# Patient Record
Sex: Male | Born: 1955 | Race: White | Hispanic: No | Marital: Married | Smoking: Current every day smoker
Health system: Southern US, Community
[De-identification: ages and names within clinical notes are randomized; demographics above are authoritative.]

## PROBLEM LIST (undated history)

## (undated) DIAGNOSIS — I10 Essential (primary) hypertension: Secondary | ICD-10-CM

## (undated) DIAGNOSIS — N200 Calculus of kidney: Secondary | ICD-10-CM

## (undated) DIAGNOSIS — K219 Gastro-esophageal reflux disease without esophagitis: Secondary | ICD-10-CM

## (undated) HISTORY — PX: APPENDECTOMY: SHX54

## (undated) HISTORY — PX: ORTHOPEDIC SURGERY: SHX850

---

## 2014-07-24 ENCOUNTER — Emergency Department (HOSPITAL_BASED_OUTPATIENT_CLINIC_OR_DEPARTMENT_OTHER)
Admission: EM | Admit: 2014-07-24 | Discharge: 2014-07-24 | Disposition: A | Discharge status: Home / Self Care | Payer: BC Managed Care – PPO | Attending: Emergency Medicine | Admitting: Emergency Medicine

## 2014-07-24 ENCOUNTER — Encounter (HOSPITAL_BASED_OUTPATIENT_CLINIC_OR_DEPARTMENT_OTHER): Payer: Self-pay | Admitting: Emergency Medicine

## 2014-07-24 DIAGNOSIS — Z79899 Other long term (current) drug therapy: Secondary | ICD-10-CM | POA: Insufficient documentation

## 2014-07-24 DIAGNOSIS — IMO0002 Reserved for concepts with insufficient information to code with codable children: Secondary | ICD-10-CM | POA: Insufficient documentation

## 2014-07-24 DIAGNOSIS — Z8719 Personal history of other diseases of the digestive system: Secondary | ICD-10-CM | POA: Insufficient documentation

## 2014-07-24 DIAGNOSIS — T6391XA Toxic effect of contact with unspecified venomous animal, accidental (unintentional), initial encounter: Secondary | ICD-10-CM | POA: Insufficient documentation

## 2014-07-24 DIAGNOSIS — F17200 Nicotine dependence, unspecified, uncomplicated: Secondary | ICD-10-CM | POA: Insufficient documentation

## 2014-07-24 DIAGNOSIS — I10 Essential (primary) hypertension: Secondary | ICD-10-CM | POA: Insufficient documentation

## 2014-07-24 DIAGNOSIS — T63461A Toxic effect of venom of wasps, accidental (unintentional), initial encounter: Secondary | ICD-10-CM | POA: Insufficient documentation

## 2014-07-24 DIAGNOSIS — Y9389 Activity, other specified: Secondary | ICD-10-CM | POA: Insufficient documentation

## 2014-07-24 DIAGNOSIS — Y929 Unspecified place or not applicable: Secondary | ICD-10-CM | POA: Insufficient documentation

## 2014-07-24 HISTORY — DX: Essential (primary) hypertension: I10

## 2014-07-24 HISTORY — DX: Gastro-esophageal reflux disease without esophagitis: K21.9

## 2014-07-24 MED ORDER — PREDNISONE 10 MG PO TABS: 20.0000 mg | ORAL_TABLET | Freq: Every day | ORAL | Status: DC

## 2014-07-24 MED ORDER — LORATADINE 10 MG PO TABS
10.0000 mg | ORAL_TABLET | Freq: Once | ORAL | Status: AC
  Administered 2014-07-24: 10 mg via ORAL
  Filled 2014-07-24: qty 1

## 2014-07-24 MED ORDER — EPINEPHRINE 0.3 MG/0.3ML IJ SOAJ: 0.3000 mg | INTRAMUSCULAR | Status: DC | PRN

## 2014-07-24 MED ORDER — PREDNISONE 50 MG PO TABS
60.0000 mg | ORAL_TABLET | Freq: Once | ORAL | Status: AC
  Administered 2014-07-24: 60 mg via ORAL
  Filled 2014-07-24 (×2): qty 1

## 2014-07-24 MED ORDER — EPINEPHRINE 0.3 MG/0.3ML IJ SOAJ: 0.3000 mg | INTRAMUSCULAR | Status: AC | PRN

## 2014-07-24 NOTE — ED Notes (Signed)
Multi bee  stings

## 2014-07-24 NOTE — Discharge Instructions (Signed)

## 2014-07-24 NOTE — ED Provider Notes (Signed)
Medical screening examination/treatment/procedure(s) were performed by non-physician practitioner and as supervising physician I was immediately available for consultation/collaboration.   EKG Interpretation None        Layla MawKristen N Merlina Marchena, DO 07/24/14 2103

## 2014-07-24 NOTE — ED Provider Notes (Signed)
CSN: [id number]     Arrival date & time 07/24/14  1813 History   First MD Initiated Contact with Patient 07/24/14 1817     Chief Complaint  Patient presents with  . Insect Bite     (Consider location/radiation/quality/duration/timing/severity/associated sxs/prior Treatment) HPI  Patient to the ED with complaints of bee stings to right lower extremity. Nathaniel Cervantes was mowing the lawn when Nathaniel Cervantes accidentally stepped into a yellow jacket hive. Nathaniel Cervantes believes Nathaniel Cervantes  Was stung 5 times. Nathaniel Cervantes came to the ED at the encouragement of his wife because last time the joint Nathaniel Cervantes was stung in swelled  up significantly. Nathaniel Cervantes denies having any SOB, CP, neck, face, tongue, lip swelling, wheezing or systemic symptoms. Nathaniel Cervantes does not have rash and his vital signs are stable. Nathaniel Cervantes is having mild/mod pain to the sting sites.  Past Medical History  Diagnosis Date  . Hypertension   . Acid reflux    Past Surgical History  Procedure Laterality Date  . Orthopedic surgery     No family history on file. History  Substance Use Topics  . Smoking status: Current Every Day Smoker  . Smokeless tobacco: Not on file  . Alcohol Use: Yes     Comment: seldom    Review of Systems  Review of Systems  Gen: no weight loss, fevers, chills, night sweats  Eyes: no discharge or drainage, no occular pain or visual changes  Nose: no epistaxis or rhinorrhea  Mouth: no dental pain, no sore throat  Neck: no neck pain  Lungs:No wheezing or hemoptysis No coughing CV:  No palpitations, dependent edema or orthopnea. No chest pain Abd: no diarrhea. No nausea or vomiting, No abdominal pain  GU: no dysuria or gross hematuria  MSK:  No muscle weakness, No  pain Neuro: no headache, no focal neurologic deficits  Skin: no rash , + stings to RLQ Psyche: no complaints     Allergies  Review of patient's allergies indicates no known allergies.  Home Medications   Prior to Admission medications   Medication Sig Start Date End Date Taking? Authorizing  Provider  lisinopril-hydrochlorothiazide (PRINZIDE,ZESTORETIC) 10-12.5 MG per tablet Take 1 tablet by mouth daily.   Yes Historical Provider, MD  traMADol (ULTRAM) 50 MG tablet Take 50 mg by mouth every 6 (six) hours as needed.   Yes Historical Provider, MD  EPINEPHrine (EPIPEN) 0.3 mg/0.3 mL IJ SOAJ injection Inject 0.3 mLs (0.3 mg total) into the muscle as needed. 07/24/14   Bedie Dominey Irine SealG Dreux Mcgroarty, PA-C  predniSONE (DELTASONE) 10 MG tablet Take 2 tablets (20 mg total) by mouth daily. 07/24/14   Nykira Reddix Irine SealG Maleigha Colvard, PA-C   BP 146/80  Pulse 56  Temp(Src) 97.7 F (36.5 C) (Oral)  Resp 18  Ht 6\' 3"  (1.905 m)  Wt 210 lb (95.255 kg)  BMI 26.25 kg/m2  SpO2 98% Physical Exam  Nursing note and vitals reviewed. Constitutional: Nathaniel Cervantes appears well-developed and well-nourished. No distress.  HENT:  Head: Normocephalic and atraumatic.  Mouth/Throat: Uvula is midline, oropharynx is clear and moist and mucous membranes are normal.  No intraoral or neck swelling.  Eyes: Pupils are equal, round, and reactive to light.  Neck: Normal range of motion. Neck supple.  Cardiovascular: Normal rate and regular rhythm.   Pulmonary/Chest: Effort normal and breath sounds normal. Nathaniel Cervantes has no wheezes. Nathaniel Cervantes has no rhonchi.  Abdominal: Soft.  Neurological: Nathaniel Cervantes is alert.  Skin: Skin is warm and dry.       ED Course  Procedures (including critical care  time) Labs Review Labs Reviewed - No data to display  Imaging Review No results found.   EKG Interpretation None      MDM   Final diagnoses:  Yellow jacket sting, accidental or unintentional, initial encounter    Patient without signs of systemic reaction. Nathaniel Cervantes took 50 mg of Benadryl at home prior to arrival. Pt given Prednisone 60 mg and told to take his at home Allegra. Rx for prednisone taper and epi pen. Discussed when it is appropriate to use epi pen. Educated on symptoms that warrant prompt return to the ED. At this time no signs of anaphylactic reaction.  58  y.o.Nathaniel Cervantes evaluation in the Emergency Department is complete. It has been determined that no acute conditions requiring further emergency intervention are present at this time. The patient/guardian have been advised of the diagnosis and plan. We have discussed signs and symptoms that warrant return to the ED, such as changes or worsening in symptoms.  Vital signs are stable at discharge. Filed Vitals:   07/24/14 1830  BP: 146/80  Pulse: 56  Temp: 97.7 F (36.5 C)  Resp: 18    Patient/guardian has voiced understanding and agreed to follow-up with the PCP or specialist.     Dorthula Matas, PA-C 07/24/14 1901

## 2015-02-03 ENCOUNTER — Emergency Department (HOSPITAL_BASED_OUTPATIENT_CLINIC_OR_DEPARTMENT_OTHER)
Admission: EM | Admit: 2015-02-03 | Discharge: 2015-02-03 | Disposition: A | Discharge status: Home / Self Care | Payer: Medicare Other | Attending: Emergency Medicine | Admitting: Emergency Medicine

## 2015-02-03 ENCOUNTER — Emergency Department (HOSPITAL_BASED_OUTPATIENT_CLINIC_OR_DEPARTMENT_OTHER): Payer: Medicare Other

## 2015-02-03 ENCOUNTER — Encounter (HOSPITAL_BASED_OUTPATIENT_CLINIC_OR_DEPARTMENT_OTHER): Payer: Self-pay | Admitting: *Deleted

## 2015-02-03 DIAGNOSIS — Z79899 Other long term (current) drug therapy: Secondary | ICD-10-CM | POA: Diagnosis not present

## 2015-02-03 DIAGNOSIS — Z720 Tobacco use: Secondary | ICD-10-CM | POA: Insufficient documentation

## 2015-02-03 DIAGNOSIS — I10 Essential (primary) hypertension: Secondary | ICD-10-CM | POA: Insufficient documentation

## 2015-02-03 DIAGNOSIS — J069 Acute upper respiratory infection, unspecified: Secondary | ICD-10-CM | POA: Diagnosis not present

## 2015-02-03 DIAGNOSIS — R509 Fever, unspecified: Secondary | ICD-10-CM | POA: Diagnosis present

## 2015-02-03 DIAGNOSIS — B349 Viral infection, unspecified: Secondary | ICD-10-CM | POA: Insufficient documentation

## 2015-02-03 DIAGNOSIS — Z8719 Personal history of other diseases of the digestive system: Secondary | ICD-10-CM | POA: Insufficient documentation

## 2015-02-03 MED ORDER — ONDANSETRON 8 MG PO TBDP
8.0000 mg | ORAL_TABLET | Freq: Once | ORAL | Status: AC
  Administered 2015-02-03: 8 mg via ORAL
  Filled 2015-02-03: qty 1

## 2015-02-03 MED ORDER — ONDANSETRON 8 MG PO TBDP: ORAL_TABLET | ORAL | Status: DC

## 2015-02-03 NOTE — ED Notes (Signed)
States having chills   Ext cold  Temp 99.8

## 2015-02-03 NOTE — ED Provider Notes (Signed)
CSN: [id number]     Arrival date & time 02/03/15  0153 History   First MD Initiated Contact with Patient 02/03/15 0225     Chief Complaint  Patient presents with  . Fever     (Consider location/radiation/quality/duration/timing/severity/associated sxs/prior Treatment) Patient is a 59 y.o. male presenting with fever. The history is provided by the patient.  Fever Temp source:  Subjective Severity:  Mild Onset quality:  Gradual Duration:  2 hours Timing:  Constant Progression:  Unchanged Chronicity:  New Worsened by:  Nothing tried Associated symptoms: congestion and rhinorrhea   Associated symptoms: no chest pain, no confusion, no cough, no diarrhea, no dysuria, no ear pain, no headaches, no myalgias, no rash, no sore throat and no vomiting     Past Medical History  Diagnosis Date  . Hypertension   . Acid reflux    Past Surgical History  Procedure Laterality Date  . Orthopedic surgery     No family history on file. History  Substance Use Topics  . Smoking status: Current Every Day Smoker  . Smokeless tobacco: Not on file  . Alcohol Use: Yes     Comment: seldom    Review of Systems  Constitutional: Negative for fever.  HENT: Positive for congestion and rhinorrhea. Negative for ear pain, sore throat, trouble swallowing and voice change.   Respiratory: Negative for cough, shortness of breath and wheezing.   Cardiovascular: Negative for chest pain.  Gastrointestinal: Negative for vomiting, abdominal pain and diarrhea.  Genitourinary: Negative for dysuria.  Musculoskeletal: Negative for myalgias, neck pain and neck stiffness.  Skin: Negative for rash and wound.  Neurological: Negative for headaches.  Psychiatric/Behavioral: Negative for confusion.  All other systems reviewed and are negative.     Allergies  Review of patient's allergies indicates no known allergies.  Home Medications   Prior to Admission medications   Medication Sig Start Date End Date  Taking? Authorizing Provider  EPINEPHrine (EPIPEN) 0.3 mg/0.3 mL IJ SOAJ injection Inject 0.3 mLs (0.3 mg total) into the muscle as needed. 07/24/14   Tiffany Irine Seal, PA-C  lisinopril-hydrochlorothiazide (PRINZIDE,ZESTORETIC) 10-12.5 MG per tablet Take 1 tablet by mouth daily.    Historical Provider, MD  predniSONE (DELTASONE) 10 MG tablet Take 2 tablets (20 mg total) by mouth daily. 07/24/14   Tiffany Irine Seal, PA-C  traMADol (ULTRAM) 50 MG tablet Take 50 mg by mouth every 6 (six) hours as needed.    Historical Provider, MD   BP 137/80 mmHg  Pulse 86  Temp(Src) 98.8 F (37.1 C) (Oral)  Resp 18  Ht  (1.93 m)  Wt 212 lb (96.163 kg)  BMI 25.82 kg/m2  SpO2 100% Physical Exam  Constitutional: He is oriented to person, place, and time. He appears well-developed and well-nourished. No distress.  HENT:  Head: Normocephalic and atraumatic.  Right Ear: External ear normal.  Left Ear: External ear normal.  Mouth/Throat: Oropharynx is clear and moist. No oropharyngeal exudate.  Eyes: Conjunctivae and EOM are normal. Pupils are equal, round, and reactive to light.  Neck: Normal range of motion. Neck supple.  Cardiovascular: Normal rate, regular rhythm and intact distal pulses.   Pulmonary/Chest: Effort normal and breath sounds normal. No respiratory distress. He has no wheezes. He has no rales.  Abdominal: Soft. Bowel sounds are normal. There is no tenderness. There is no rebound and no guarding.  Musculoskeletal: Normal range of motion.  Neurological: He is alert and oriented to person, place, and time.  Skin: Skin is warm  and dry.  Psychiatric: He has a normal mood and affect.    ED Course  Procedures (including critical care time) Labs Review Labs Reviewed - No data to display  Imaging Review Dg Chest 2 View  02/03/2015   CLINICAL DATA:  Acute onset of chills and fever.  Initial encounter.  EXAM: CHEST  2 VIEW  COMPARISON:  None.  FINDINGS: The lungs are well-aerated. Vascular  congestion is noted, with increased interstitial markings. Given the patient's symptoms, this could reflect pneumonia. There is no evidence of pleural effusion or pneumothorax.  The heart is normal in size; the mediastinal contour is within normal limits. No acute osseous abnormalities are seen.  IMPRESSION: Vascular congestion, with increased interstitial markings. Given the patient's symptoms, this could reflect pneumonia.   Electronically Signed   By: Roanna RaiderJeffery  Chang M.D.   On: 02/03/2015 02:45     EKG Interpretation None      MDM   Final diagnoses:  URI (upper respiratory infection)  Viral syndrome   Seen and appreciate radiology note but there is no consolidation on Xray.  Re reviewed with Dr. Cherly Hensenhang via phone.    Highly doubt pneumonia as patient has not had cough and has not actually had fever.  There are no wheezes rales or rhonchi on exam and wife started vomiting in room during patient evaluation and then patient became nauseated in Xray.  Suspect this is a viral syndrome of n/v/d with runny nose etc.  Strict precautions given.      Jasmine AweApril K Reina Wilton-Rasch, MD 02/03/15 [phone]

## 2015-02-03 NOTE — ED Notes (Signed)
C/o chills,  Ext cold  States had a fever of 99.8

## 2016-06-16 ENCOUNTER — Emergency Department (HOSPITAL_BASED_OUTPATIENT_CLINIC_OR_DEPARTMENT_OTHER)
Admission: EM | Admit: 2016-06-16 | Discharge: 2016-06-16 | Disposition: A | Discharge status: Home / Self Care | Payer: Medicare Other | Attending: Emergency Medicine | Admitting: Emergency Medicine

## 2016-06-16 ENCOUNTER — Encounter (HOSPITAL_BASED_OUTPATIENT_CLINIC_OR_DEPARTMENT_OTHER): Payer: Self-pay

## 2016-06-16 DIAGNOSIS — F17200 Nicotine dependence, unspecified, uncomplicated: Secondary | ICD-10-CM | POA: Insufficient documentation

## 2016-06-16 DIAGNOSIS — R112 Nausea with vomiting, unspecified: Secondary | ICD-10-CM | POA: Insufficient documentation

## 2016-06-16 DIAGNOSIS — I10 Essential (primary) hypertension: Secondary | ICD-10-CM | POA: Diagnosis not present

## 2016-06-16 DIAGNOSIS — R197 Diarrhea, unspecified: Secondary | ICD-10-CM | POA: Insufficient documentation

## 2016-06-16 DIAGNOSIS — Z79899 Other long term (current) drug therapy: Secondary | ICD-10-CM | POA: Diagnosis not present

## 2016-06-16 DIAGNOSIS — R42 Dizziness and giddiness: Secondary | ICD-10-CM | POA: Diagnosis not present

## 2016-06-16 LAB — CBC WITH DIFFERENTIAL/PLATELET
BASOS ABS: 0 10*3/uL (ref 0.0–0.1)
BASOS PCT: 0 %
EOS ABS: 0.1 10*3/uL (ref 0.0–0.7)
Eosinophils Relative: 1 %
HCT: 43.3 % (ref 39.0–52.0)
HEMOGLOBIN: 14.8 g/dL (ref 13.0–17.0)
Lymphocytes Relative: 2 %
Lymphs Abs: 0.3 10*3/uL — ABNORMAL LOW (ref 0.7–4.0)
MCH: 30.4 pg (ref 26.0–34.0)
MCHC: 34.2 g/dL (ref 30.0–36.0)
MCV: 88.9 fL (ref 78.0–100.0)
MONOS PCT: 3 %
Monocytes Absolute: 0.3 10*3/uL (ref 0.1–1.0)
NEUTROS ABS: 12.3 10*3/uL — AB (ref 1.7–7.7)
NEUTROS PCT: 94 %
Platelets: 214 10*3/uL (ref 150–400)
RBC: 4.87 MIL/uL (ref 4.22–5.81)
RDW: 14.4 % (ref 11.5–15.5)
WBC: 13 10*3/uL — AB (ref 4.0–10.5)

## 2016-06-16 LAB — BASIC METABOLIC PANEL
ANION GAP: 10 (ref 5–15)
BUN: 10 mg/dL (ref 6–20)
CALCIUM: 8.8 mg/dL — AB (ref 8.9–10.3)
CO2: 26 mmol/L (ref 22–32)
CREATININE: 0.96 mg/dL (ref 0.61–1.24)
Chloride: 106 mmol/L (ref 101–111)
GFR calc Af Amer: >60 mL/min (ref >60)
Glucose, Bld: 110 mg/dL — ABNORMAL HIGH (ref 65–99)
Potassium: 3.7 mmol/L (ref 3.5–5.1)
SODIUM: 142 mmol/L (ref 135–145)

## 2016-06-16 MED ORDER — SODIUM CHLORIDE 0.9 % IV BOLUS (SEPSIS)
1000.0000 mL | Freq: Once | INTRAVENOUS | Status: AC
Start: 2016-06-16 — End: 2016-06-16
  Administered 2016-06-16: 1000 mL via INTRAVENOUS

## 2016-06-16 MED ORDER — ONDANSETRON HCL 4 MG/2ML IJ SOLN
4.0000 mg | Freq: Once | INTRAMUSCULAR | Status: AC
  Administered 2016-06-16: 4 mg via INTRAVENOUS
  Filled 2016-06-16: qty 2

## 2016-06-16 MED ORDER — ONDANSETRON 8 MG PO TBDP: 8.0000 mg | ORAL_TABLET | Freq: Three times a day (TID) | ORAL | Status: AC | PRN

## 2016-06-16 MED ORDER — SODIUM CHLORIDE 0.9 % IV BOLUS (SEPSIS)
1000.0000 mL | Freq: Once | INTRAVENOUS | Status: AC
  Administered 2016-06-16: 1000 mL via INTRAVENOUS

## 2016-06-16 MED FILL — ONDANSETRON ODT 8 MG TABLET: 8 | 4 days supply | Qty: 10 | Fill #0

## 2016-06-16 NOTE — ED Notes (Signed)
MD at bedside. 

## 2016-06-16 NOTE — ED Provider Notes (Signed)
CSN: [id number]     Arrival date & time 06/16/16  1108 History   First MD Initiated Contact with Patient 06/16/16 1128     Chief Complaint  Patient presents with  . Diarrhea     Patient is a 60 y.o. male presenting with diarrhea. The history is provided by the patient.  Diarrhea Associated symptoms: abdominal pain and vomiting   Associated symptoms: no headaches   Patient presents with nausea vomiting diarrhea. Began this morning. Has had numerous episodes of both. Decreased oral intake.States he is somewhat hungry but if he drinks even a little water comes back up. States he felt lightheaded and thought his to pass out. No sick contacts. States he was fishing yesterday and had a bologna sandwich but was one that he made. Slight upper abdominal pain was started but has since cleared up. No fevers or chills. Past Medical History  Diagnosis Date  . Hypertension   . Acid reflux    Past Surgical History  Procedure Laterality Date  . Orthopedic surgery     No family history on file. Social History  Substance Use Topics  . Smoking status: Current Every Day Smoker  . Smokeless tobacco: None  . Alcohol Use: Yes     Comment: occ    Review of Systems  Constitutional: Negative for activity change and appetite change.  Eyes: Negative for pain.  Respiratory: Negative for chest tightness and shortness of breath.   Cardiovascular: Negative for chest pain and leg swelling.  Gastrointestinal: Positive for nausea, vomiting, abdominal pain and diarrhea.  Genitourinary: Negative for flank pain.  Musculoskeletal: Negative for back pain and neck stiffness.  Skin: Negative for rash.  Neurological: Positive for light-headedness. Negative for weakness, numbness and headaches.  Psychiatric/Behavioral: Negative for behavioral problems.      Allergies  Review of patient's allergies indicates no known allergies.  Home Medications   Prior to Admission medications   Medication Sig Start Date End  Date Taking? Authorizing Provider  bisoprolol-hydrochlorothiazide (ZIAC) 10-6.25 MG tablet Take 1 tablet by mouth daily.   Yes Historical Provider, MD  EPINEPHrine (EPIPEN) 0.3 mg/0.3 mL IJ SOAJ injection Inject 0.3 mLs (0.3 mg total) into the muscle as needed. 07/24/14   Tiffany Neva SeatGreene, PA-C  ondansetron (ZOFRAN-ODT) 8 MG disintegrating tablet Take 1 tablet (8 mg total) by mouth every 8 (eight) hours as needed for nausea or vomiting. 06/16/16   Benjiman CoreNathan Amirrah Quigley, MD  traMADol (ULTRAM) 50 MG tablet Take 50 mg by mouth every 6 (six) hours as needed.    Historical Provider, MD   BP 110/64 mmHg  Pulse 80  Temp(Src) 99.5 F (37.5 C) (Oral)  Resp 18  Ht 6\' 4"  (1.93 m)  Wt 213 lb (96.616 kg)  BMI 25.94 kg/m2  SpO2 96% Physical Exam  Constitutional: He appears well-developed.  HENT:  Head: Atraumatic.  Eyes: EOM are normal.  Neck: Neck supple.  Cardiovascular: Normal rate.   Abdominal: Soft. There is no tenderness.  Musculoskeletal: Normal range of motion.  Neurological: He is alert.  Skin: Skin is warm.    ED Course  Procedures (including critical care time) Labs Review Labs Reviewed  BASIC METABOLIC PANEL - Abnormal; Notable for the following:    Glucose, Bld 110 (*)    Calcium 8.8 (*)    All other components within normal limits  CBC WITH DIFFERENTIAL/PLATELET - Abnormal; Notable for the following:    WBC 13.0 (*)    Neutro Abs 12.3 (*)    Lymphs Abs 0.3 (*)  All other components within normal limits    Imaging Review No results found. I have personally reviewed and evaluated these images and lab results as part of my medical decision-making.   EKG Interpretation None      MDM   Final diagnoses:  Nausea vomiting and diarrhea    Patient presents nausea vomiting diarrhea. Began earlier in the day. Felt better after IV fluids but continued to have some hypotension. Total of 3 L of fluid given and tolerated orals. Blood pressure improved. Lab work reassuring. Discharge  home.    Benjiman CoreNathan Ronetta Molla, MD 06/17/16 [phone]

## 2016-06-16 NOTE — Discharge Instructions (Signed)

## 2016-06-16 NOTE — ED Notes (Signed)
Patient is sipping on some ginger ale.

## 2016-06-16 NOTE — ED Notes (Addendum)
C/o n/v/d since 630am-NAD-steady gait-states he does feel a bit light headed-taken to tx area via w/c

## 2017-03-28 ENCOUNTER — Emergency Department (HOSPITAL_BASED_OUTPATIENT_CLINIC_OR_DEPARTMENT_OTHER)
Admission: EM | Admit: 2017-03-28 | Discharge: 2017-03-29 | Disposition: A | Discharge status: Home / Self Care | Payer: Medicare Other | Attending: Emergency Medicine | Admitting: Emergency Medicine

## 2017-03-28 ENCOUNTER — Encounter (HOSPITAL_BASED_OUTPATIENT_CLINIC_OR_DEPARTMENT_OTHER): Payer: Self-pay | Admitting: Emergency Medicine

## 2017-03-28 DIAGNOSIS — N200 Calculus of kidney: Secondary | ICD-10-CM

## 2017-03-28 DIAGNOSIS — I10 Essential (primary) hypertension: Secondary | ICD-10-CM | POA: Diagnosis not present

## 2017-03-28 DIAGNOSIS — Z79899 Other long term (current) drug therapy: Secondary | ICD-10-CM | POA: Diagnosis not present

## 2017-03-28 DIAGNOSIS — F17200 Nicotine dependence, unspecified, uncomplicated: Secondary | ICD-10-CM | POA: Diagnosis not present

## 2017-03-28 DIAGNOSIS — R1032 Left lower quadrant pain: Secondary | ICD-10-CM | POA: Diagnosis present

## 2017-03-28 HISTORY — DX: Calculus of kidney: N20.0

## 2017-03-28 NOTE — ED Triage Notes (Signed)
Pt presents to ED with c/o of LLQ pain that started last night and got worse tonight.

## 2017-03-29 ENCOUNTER — Emergency Department (HOSPITAL_BASED_OUTPATIENT_CLINIC_OR_DEPARTMENT_OTHER): Payer: Medicare Other

## 2017-03-29 LAB — COMPREHENSIVE METABOLIC PANEL
ALBUMIN: 3.9 g/dL (ref 3.5–5.0)
ALT: 40 U/L (ref 17–63)
AST: 27 U/L (ref 15–41)
Alkaline Phosphatase: 67 U/L (ref 38–126)
Anion gap: 11 (ref 5–15)
BILIRUBIN TOTAL: 0.2 mg/dL — AB (ref 0.3–1.2)
BUN: 13 mg/dL (ref 6–20)
CO2: 25 mmol/L (ref 22–32)
Calcium: 9.5 mg/dL (ref 8.9–10.3)
Chloride: 101 mmol/L (ref 101–111)
Creatinine, Ser: 1.24 mg/dL (ref 0.61–1.24)
GFR calc Af Amer: >60 mL/min (ref >60)
GFR calc non Af Amer: >60 mL/min (ref >60)
GLUCOSE: 119 mg/dL — AB (ref 65–99)
POTASSIUM: 3.6 mmol/L (ref 3.5–5.1)
Sodium: 137 mmol/L (ref 135–145)
Total Protein: 6.7 g/dL (ref 6.5–8.1)

## 2017-03-29 LAB — CBC WITH DIFFERENTIAL/PLATELET
Basophils Absolute: 0.1 10*3/uL (ref 0.0–0.1)
Basophils Relative: 1 %
Eosinophils Absolute: 0.7 10*3/uL (ref 0.0–0.7)
Eosinophils Relative: 7 %
HEMATOCRIT: 41.4 % (ref 39.0–52.0)
Hemoglobin: 14.5 g/dL (ref 13.0–17.0)
LYMPHS PCT: 26 %
Lymphs Abs: 2.8 10*3/uL (ref 0.7–4.0)
MCH: 31.1 pg (ref 26.0–34.0)
MCHC: 35 g/dL (ref 30.0–36.0)
MCV: 88.8 fL (ref 78.0–100.0)
MONO ABS: 1.2 10*3/uL — AB (ref 0.1–1.0)
Monocytes Relative: 11 %
NEUTROS ABS: 6.3 10*3/uL (ref 1.7–7.7)
Neutrophils Relative %: 57 %
Platelets: 279 10*3/uL (ref 150–400)
RBC: 4.66 MIL/uL (ref 4.22–5.81)
RDW: 13.4 % (ref 11.5–15.5)
WBC: 11 10*3/uL — ABNORMAL HIGH (ref 4.0–10.5)

## 2017-03-29 LAB — URINALYSIS, ROUTINE W REFLEX MICROSCOPIC
Bilirubin Urine: NEGATIVE
Glucose, UA: NEGATIVE mg/dL
KETONES UR: NEGATIVE mg/dL
Leukocytes, UA: NEGATIVE
Nitrite: NEGATIVE
PH: 6.5 (ref 5.0–8.0)
Protein, ur: NEGATIVE mg/dL
Specific Gravity, Urine: 1.017 (ref 1.005–1.030)

## 2017-03-29 LAB — URINALYSIS, MICROSCOPIC (REFLEX)

## 2017-03-29 LAB — LIPASE, BLOOD: Lipase: 33 U/L (ref 11–51)

## 2017-03-29 MED ORDER — TAMSULOSIN HCL 0.4 MG PO CAPS: 0.4000 mg | ORAL_CAPSULE | Freq: Every day | ORAL | 0 refills | Status: AC

## 2017-03-29 MED ORDER — OXYCODONE-ACETAMINOPHEN 5-325 MG PO TABS: 1.0000 | ORAL_TABLET | Freq: Four times a day (QID) | ORAL | 0 refills | Status: AC | PRN

## 2017-03-29 MED ORDER — SODIUM CHLORIDE 0.9 % IV BOLUS (SEPSIS)
1000.0000 mL | Freq: Once | INTRAVENOUS | Status: AC
  Administered 2017-03-29: 1000 mL via INTRAVENOUS

## 2017-03-29 MED ORDER — IOPAMIDOL (ISOVUE-300) INJECTION 61%
100.0000 mL | Freq: Once | INTRAVENOUS | Status: AC | PRN
  Administered 2017-03-29: 100 mL via INTRAVENOUS

## 2017-03-29 MED ORDER — MORPHINE SULFATE (PF) 4 MG/ML IV SOLN
4.0000 mg | Freq: Once | INTRAVENOUS | Status: AC
  Administered 2017-03-29: 4 mg via INTRAVENOUS
  Filled 2017-03-29: qty 1

## 2017-03-29 MED ORDER — ONDANSETRON HCL 4 MG/2ML IJ SOLN
4.0000 mg | Freq: Once | INTRAMUSCULAR | Status: AC
  Administered 2017-03-29: 4 mg via INTRAVENOUS
  Filled 2017-03-29: qty 2

## 2017-03-29 NOTE — Discharge Instructions (Signed)
Percocet as prescribed as needed for pain.  Permax daily as prescribed.  Follow-up with urology if you have not passed the stone in the next 2 days. The contact information for [facility] has been provided in this discharge summary for you to call and make these arrangements.   Return to the emergency department in the meantime if you develop high fever, worsening pain, vomiting, or other new and concerning symptoms.

## 2017-03-29 NOTE — ED Notes (Signed)
ED Provider at bedside. 

## 2017-03-29 NOTE — ED Notes (Signed)
Pt c/o LLQ pain that radiates to back at times onset yesterday. Denies N/V/D. No BM today.

## 2017-03-29 NOTE — ED Provider Notes (Signed)
[facility] Provider Note   CSN: [id number] Arrival date & time: 03/28/17  2345  By signing my name below, I, Nathaniel Cervantes, attest that this documentation has been prepared under the direction and in the presence of Geoffery Lyons, MD . Electronically Signed: Nelwyn Cervantes, Scribe. 03/29/2017. 12:01 AM.  History   Chief Complaint Chief Complaint  Patient presents with  . Abdominal Pain   The history is provided by the patient. No language interpreter was used.    HPI Comments:  Nathaniel Cervantes is a 61 y.o. male with pmhx of HTN who presents to the Emergency Department complaining of intermittent, worsening, moderate LLQ abdominal pain onset 2 days ago. He describes his symptoms as a 7/10 pain which occasionally radiates to his back. His symptoms are alleviated when he lays on his left side. P/o intake normal. He reports associated increased flatulence. No medications taken PTA. Denies any diarrhea, difficulty urinating or dysuria. Pt has a previous abdominal shx of appendectomy.   Past Medical History:  Diagnosis Date  . Acid reflux   . Hypertension   . Kidney stones, calcium oxalate     There are no active problems to display for this patient.   Past Surgical History:  Procedure Laterality Date  . APPENDECTOMY    . ORTHOPEDIC SURGERY         Home Medications    Prior to Admission medications   Medication Sig Start Date End Date Taking? Authorizing Provider  bisoprolol-hydrochlorothiazide (ZIAC) 10-6.25 MG tablet Take 1 tablet by mouth daily.    Historical Provider, MD  EPINEPHrine (EPIPEN) 0.3 mg/0.3 mL IJ SOAJ injection Inject 0.3 mLs (0.3 mg total) into the muscle as needed. 07/24/14   Tiffany Neva Seat, PA-C  ondansetron (ZOFRAN-ODT) 8 MG disintegrating tablet Take 1 tablet (8 mg total) by mouth every 8 (eight) hours as needed for nausea or vomiting. 06/16/16   Benjiman Core, MD  traMADol (ULTRAM) 50 MG tablet Take 50 mg by mouth every 6 (six) hours as needed.     Historical Provider, MD    Family History No family history on file.  Social History Social History  Substance Use Topics  . Smoking status: Current Every Day Smoker  . Smokeless tobacco: Not on file  . Alcohol use Yes     Comment: occ     Allergies   Patient has no known allergies.   Review of Systems Review of Systems  Gastrointestinal: Positive for abdominal pain. Negative for diarrhea.       Positive for Flatulence  Genitourinary: Negative for difficulty urinating and dysuria.  All other systems reviewed and are negative.    Physical Exam Updated Vital Signs BP (!) 191/89 (BP Location: Left Arm)   Pulse (!) 55   Temp 98.1 F (36.7 C) (Oral)   Resp (!) 22   Ht 6' 3.5" (1.918 m)   Wt 217 lb (98.4 kg)   SpO2 100%   BMI 26.77 kg/m   Physical Exam  Constitutional: He is oriented to person, place, and time. He appears well-developed and well-nourished.  HENT:  Head: Normocephalic and atraumatic.  Eyes: EOM are normal.  Neck: Normal range of motion.  Cardiovascular: Normal rate, regular rhythm, normal heart sounds and intact distal pulses.   Pulmonary/Chest: Effort normal and breath sounds normal. No respiratory distress.  Abdominal: Soft. He exhibits no distension. There is tenderness. There is no rebound and no guarding.  TTP of left mid-abdomen. No rebound or guarding.   Musculoskeletal: Normal range of  motion.  Neurological: He is alert and oriented to person, place, and time.  Skin: Skin is warm and dry.  Psychiatric: He has a normal mood and affect. Judgment normal.  Nursing note and vitals reviewed.   ED Treatments / Results  DIAGNOSTIC STUDIES:  Oxygen Saturation is 100% on RA, normal by my interpretation.    COORDINATION OF CARE:  12:12 AM Discussed treatment plan with pt at bedside which includes blood work and CT imaging and pt agreed to plan.  Labs (all labs ordered are listed, but only abnormal results are displayed) Labs Reviewed - No  data to display  EKG  EKG Interpretation None       Radiology No results found.  Procedures Procedures (including critical care time)  Medications Ordered in ED Medications - No data to display   Initial Impression / Assessment and Plan / ED Course  I have reviewed the triage vital signs and the nursing notes.  Pertinent labs & imaging results that were available during my care of the patient were reviewed by me and considered in my medical decision making (see chart for details).  Patient presents with left-sided abdominal pain for the past day and a half. His workup reveals a 5 mm stone in the proximal ureter. He will be treated with pain medication and follow-up with urology if not improving.  Final Clinical Impressions(s) / ED Diagnoses   Final diagnoses:  None    New Prescriptions New Prescriptions   No medications on file  I personally performed the services described in this documentation, which was scribed in my presence. The recorded information has been reviewed and is accurate.        Geoffery Lyons, MD 03/29/17 [phone]

## 2018-10-19 IMAGING — CT CT ABD-PELV W/ CM
2 of 5 series · 15 of 46 positions shown, 17 images · IV contrast (APPLIED)
Comparison: None.

CLINICAL DATA: 60 y/o M; worsening left lower quadrant abdominal
pain. History of hypertension, kidney stones, and appendectomy.

EXAM:
CT ABDOMEN AND PELVIS WITH CONTRAST
TECHNIQUE: Multidetector CT imaging of the abdomen and pelvis was performed
using the standard protocol following bolus administration of
intravenous contrast.
CONTRAST:  100mL [id number] IOPAMIDOL ([id number]) INJECTION 61%

[Series 2: axial st · axial · 0.78mm/px · z∈[-751,-296]mm · 12 of 103 slices shown, 14 images]
[im 6/103  soft-tissue]
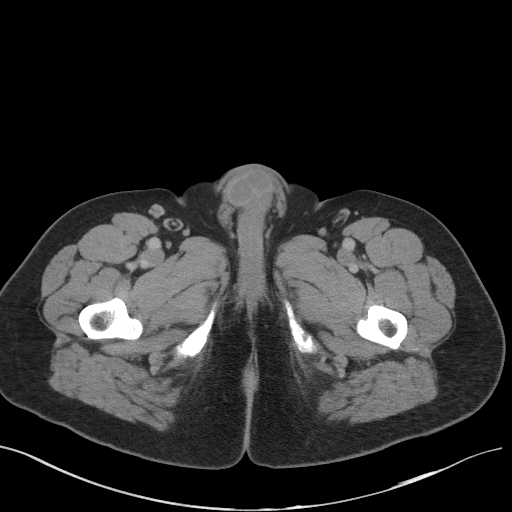
[im 6/103  bone]
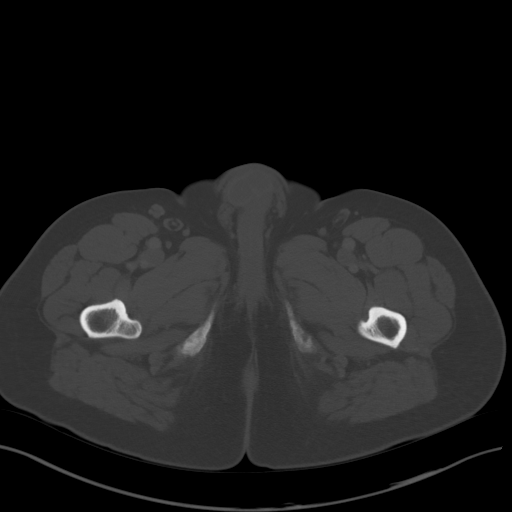
[im 16/103  soft-tissue]
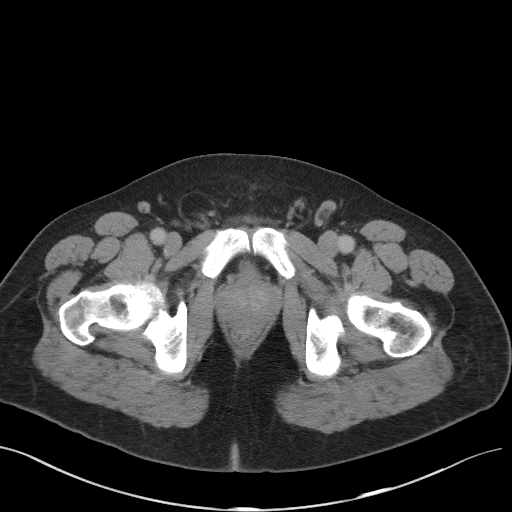
[im 21/103  soft-tissue]
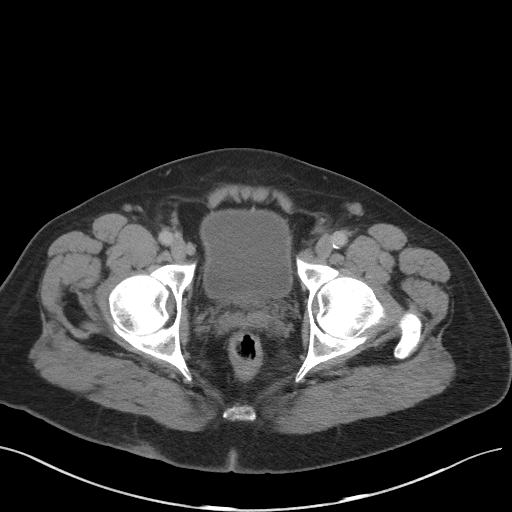
[im 31/103  soft-tissue]
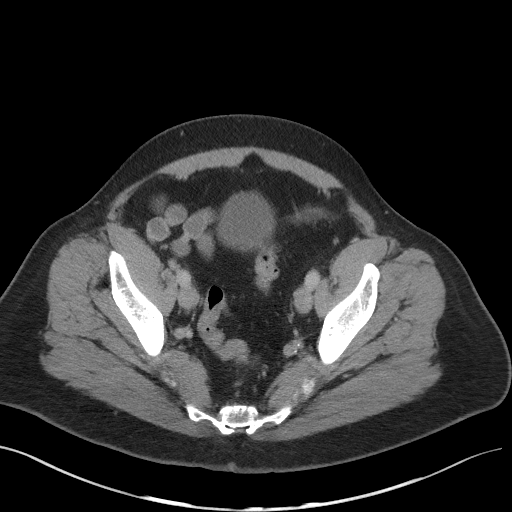
[im 41/103  soft-tissue]
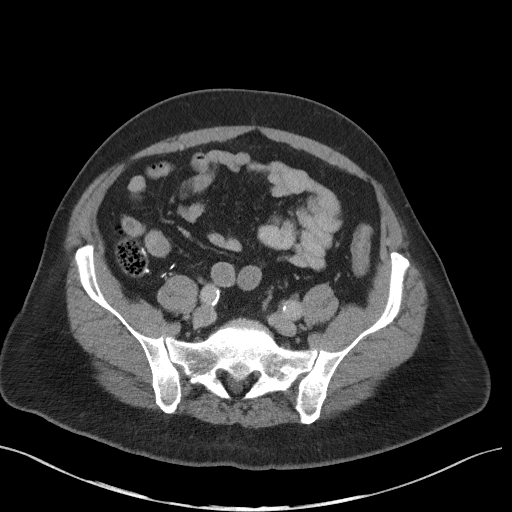
[im 46/103  soft-tissue]
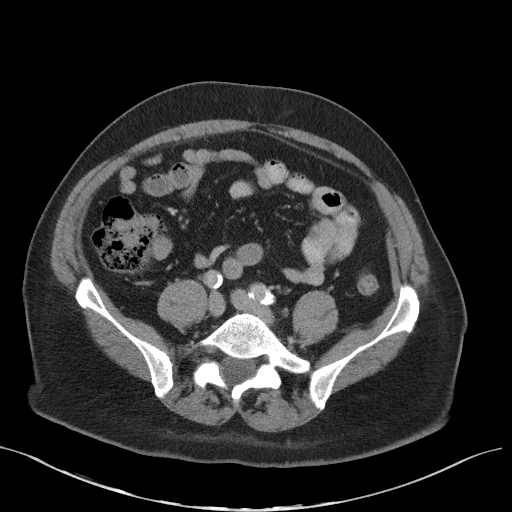
[im 57/103  soft-tissue]
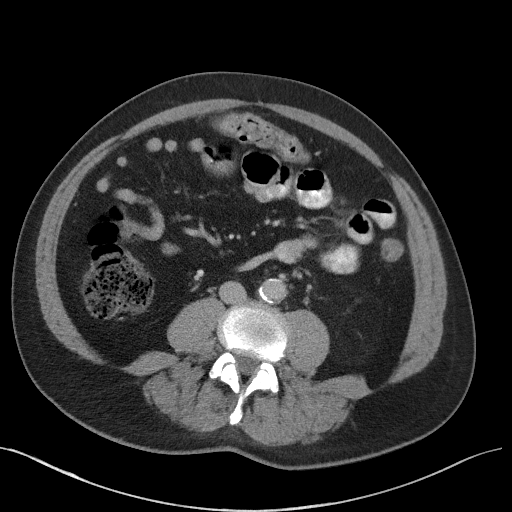
[im 62/103  soft-tissue]
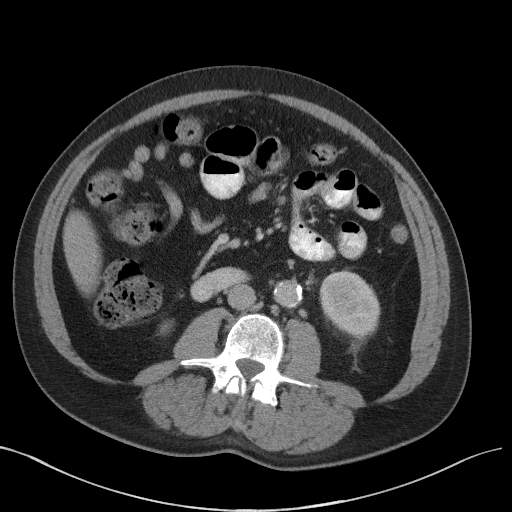
[im 72/103  soft-tissue]
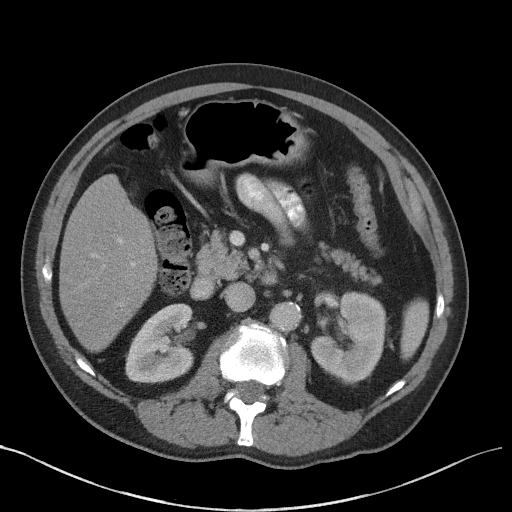
[im 72/103  bone]
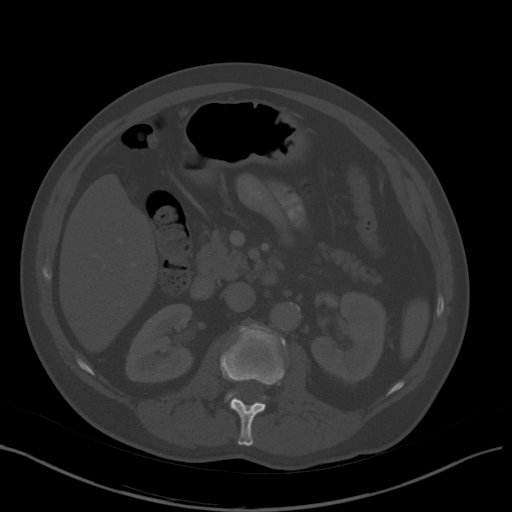
[im 82/103  soft-tissue]
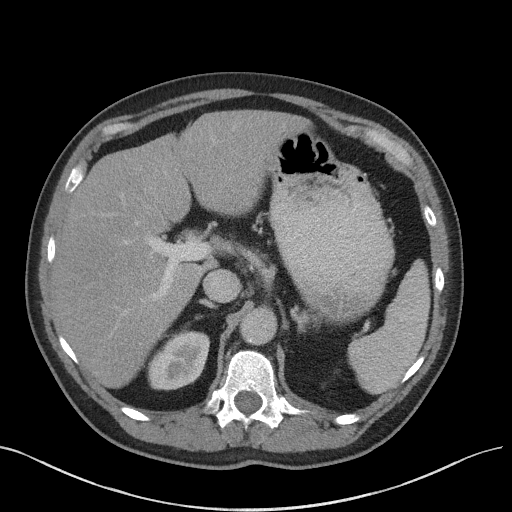
[im 87/103  soft-tissue]
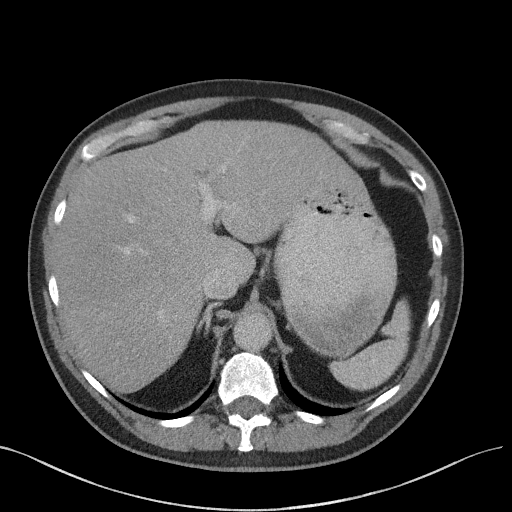
[im 97/103  soft-tissue]
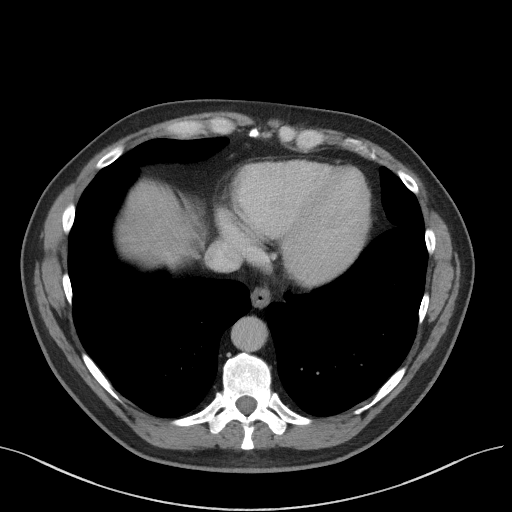

[Series 5: coronal st · coronal · 0.77mm/px · 3 of 110 slices shown]
[im 37/110  soft-tissue]
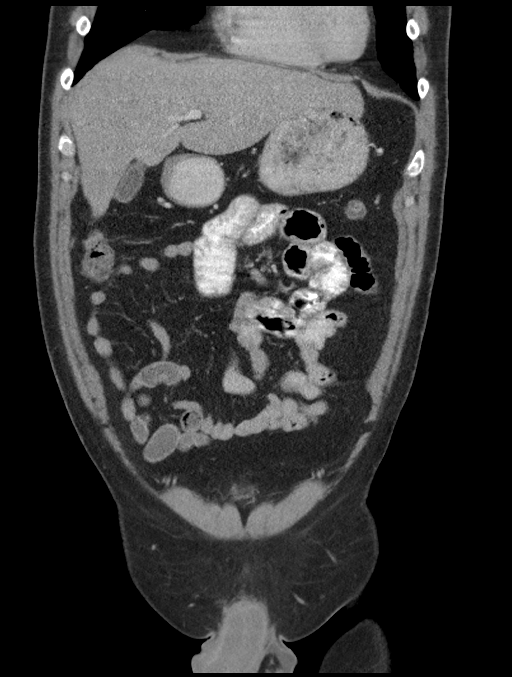
[im 49/110  soft-tissue]
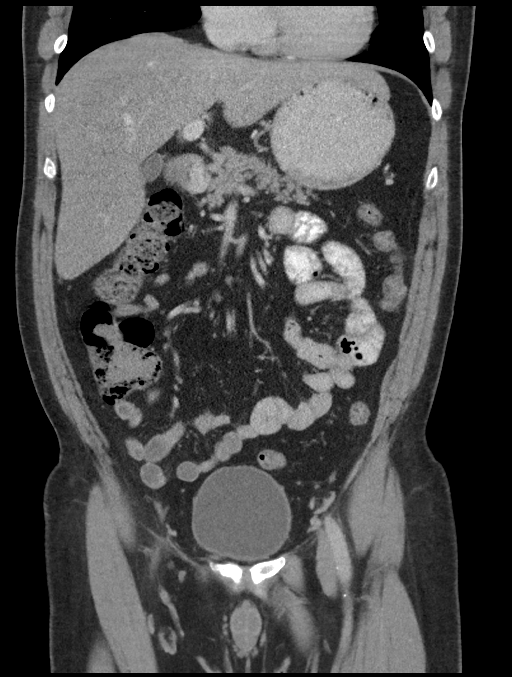
[im 61/110  soft-tissue]
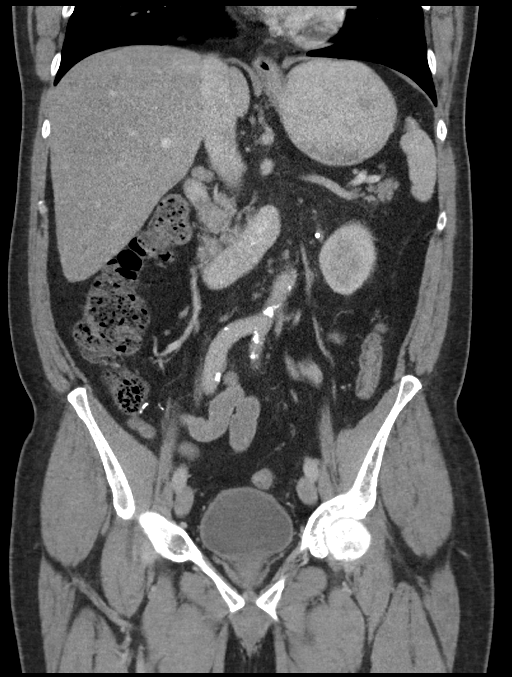

[15 of 46 positions shown; findings below may reference images not displayed]

FINDINGS: Lower chest: No acute abnormality.

Hepatobiliary: Subcentimeter lucencies in segment 2, segment 8, and
segment 6 likely representing cysts. Otherwise no focal liver lesion
identified. Normal gallbladder. No intra or extrahepatic biliary
ductal dilatation.

Pancreas: Unremarkable. No pancreatic ductal dilatation or
surrounding inflammatory changes.

Spleen: Normal in size without focal abnormality.

Adrenals/Urinary Tract: Multiple punctate nonobstructing kidney
stones bilaterally and 4 mm stone in the left interpolar region. No
right hydronephrosis.

Mild left pelviectasis and perinephric stranding secondary to a 5 mm
stone at the left ureteropelvic junction (Series 4, image 36).
Delayed left-sided nephrogram.

Normal adrenal glands and bladder.

Stomach/Bowel: Stomach is within normal limits. Appendix resected.
No evidence of bowel wall thickening, distention, or inflammatory
changes.

Vascular/Lymphatic: Aortic atherosclerosis with moderate
calcification. No enlarged abdominal or pelvic lymph nodes.

Reproductive: Mild prostate enlargement.

Other: No abdominal wall hernia or abnormality. No abdominopelvic
ascites.

Musculoskeletal: Mild levocurvature of the lumbar spine. Lumbar
spondylosis with mild discogenic and moderate lower lumbar facet
arthropathy.
IMPRESSION: 1. Mild left pelviectasis, perinephric stranding, and delayed
nephrogram secondary to a 5 mm stone at the left ureteropelvic
junction.
2. Bilateral nonobstructing kidney stones.
3. Calcific aortic atherosclerosis.
4. Mild prostate enlargement.
5. Lumbar spondylosis with prominent facet arthropathy.

By: Quirijn Amazigh M.D.

## 2018-10-23 ENCOUNTER — Other Ambulatory Visit: Payer: Self-pay

## 2018-10-23 ENCOUNTER — Emergency Department (HOSPITAL_BASED_OUTPATIENT_CLINIC_OR_DEPARTMENT_OTHER): Payer: Medicare HMO

## 2018-10-23 ENCOUNTER — Encounter (HOSPITAL_BASED_OUTPATIENT_CLINIC_OR_DEPARTMENT_OTHER): Payer: Self-pay | Admitting: Emergency Medicine

## 2018-10-23 ENCOUNTER — Inpatient Hospital Stay (HOSPITAL_BASED_OUTPATIENT_CLINIC_OR_DEPARTMENT_OTHER)
Admission: EM | Admit: 2018-10-23 | Discharge: 2018-10-25 | DRG: 603 | Disposition: A | Discharge status: Home / Self Care | Payer: Medicare HMO | Attending: Internal Medicine | Admitting: Internal Medicine

## 2018-10-23 DIAGNOSIS — K625 Hemorrhage of anus and rectum: Secondary | ICD-10-CM | POA: Diagnosis present

## 2018-10-23 DIAGNOSIS — Z720 Tobacco use: Secondary | ICD-10-CM | POA: Diagnosis present

## 2018-10-23 DIAGNOSIS — Z23 Encounter for immunization: Secondary | ICD-10-CM

## 2018-10-23 DIAGNOSIS — L03116 Cellulitis of left lower limb: Secondary | ICD-10-CM | POA: Diagnosis not present

## 2018-10-23 DIAGNOSIS — Z79899 Other long term (current) drug therapy: Secondary | ICD-10-CM

## 2018-10-23 DIAGNOSIS — Z888 Allergy status to other drugs, medicaments and biological substances status: Secondary | ICD-10-CM

## 2018-10-23 DIAGNOSIS — K219 Gastro-esophageal reflux disease without esophagitis: Secondary | ICD-10-CM | POA: Diagnosis present

## 2018-10-23 DIAGNOSIS — M7989 Other specified soft tissue disorders: Secondary | ICD-10-CM

## 2018-10-23 DIAGNOSIS — F17200 Nicotine dependence, unspecified, uncomplicated: Secondary | ICD-10-CM | POA: Diagnosis present

## 2018-10-23 DIAGNOSIS — I10 Essential (primary) hypertension: Secondary | ICD-10-CM | POA: Diagnosis present

## 2018-10-23 LAB — CBC WITH DIFFERENTIAL/PLATELET
ABS IMMATURE GRANULOCYTES: 0.13 10*3/uL — AB (ref 0.00–0.07)
BASOS PCT: 1 %
Basophils Absolute: 0.1 10*3/uL (ref 0.0–0.1)
EOS ABS: 0.3 10*3/uL (ref 0.0–0.5)
Eosinophils Relative: 2 %
HEMATOCRIT: 42.4 % (ref 39.0–52.0)
Hemoglobin: 14.1 g/dL (ref 13.0–17.0)
Immature Granulocytes: 1 %
LYMPHS ABS: 2.4 10*3/uL (ref 0.7–4.0)
Lymphocytes Relative: 16 %
MCH: 29.9 pg (ref 26.0–34.0)
MCHC: 33.3 g/dL (ref 30.0–36.0)
MCV: 89.8 fL (ref 80.0–100.0)
MONO ABS: 1.1 10*3/uL — AB (ref 0.1–1.0)
MONOS PCT: 7 %
Neutro Abs: 10.8 10*3/uL — ABNORMAL HIGH (ref 1.7–7.7)
Neutrophils Relative %: 73 %
PLATELETS: 312 10*3/uL (ref 150–400)
RBC: 4.72 MIL/uL (ref 4.22–5.81)
RDW: 13.6 % (ref 11.5–15.5)
WBC: 14.7 10*3/uL — ABNORMAL HIGH (ref 4.0–10.5)
nRBC: 0 % (ref 0.0–0.2)

## 2018-10-23 LAB — BASIC METABOLIC PANEL
Anion gap: 13 (ref 5–15)
BUN: 15 mg/dL (ref 8–23)
CALCIUM: 9.5 mg/dL (ref 8.9–10.3)
CO2: 26 mmol/L (ref 22–32)
Chloride: 96 mmol/L — ABNORMAL LOW (ref 98–111)
Creatinine, Ser: 1.01 mg/dL (ref 0.61–1.24)
GFR calc Af Amer: >60 mL/min (ref >60)
GFR calc non Af Amer: >60 mL/min (ref >60)
GLUCOSE: 112 mg/dL — AB (ref 70–99)
Potassium: 3.5 mmol/L (ref 3.5–5.1)
Sodium: 135 mmol/L (ref 135–145)

## 2018-10-23 MED ORDER — SODIUM CHLORIDE 0.9 % IV SOLN
1.0000 g | Freq: Once | INTRAVENOUS | Status: AC
  Administered 2018-10-23: 1 g via INTRAVENOUS
  Filled 2018-10-23: qty 10

## 2018-10-23 MED ORDER — MORPHINE SULFATE (PF) 4 MG/ML IV SOLN
4.0000 mg | Freq: Once | INTRAVENOUS | Status: AC
  Administered 2018-10-23: 4 mg via INTRAVENOUS
  Filled 2018-10-23: qty 1

## 2018-10-23 MED ORDER — SODIUM CHLORIDE 0.9 % IV SOLN
INTRAVENOUS | Status: DC | PRN
  Administered 2018-10-23: 500 mL via INTRAVENOUS

## 2018-10-23 MED ORDER — VANCOMYCIN HCL IN DEXTROSE 1-5 GM/200ML-% IV SOLN
1000.0000 mg | Freq: Once | INTRAVENOUS | Status: AC
  Administered 2018-10-23: 1000 mg via INTRAVENOUS
  Filled 2018-10-23: qty 200

## 2018-10-23 NOTE — Care Management (Signed)
This is a no charge note  Transfer from [facility] per Dr. Donnald Garre  62 year old male with past medical history of hypertension, GERD, tobacco abuse, BPH, kidney stone, who presents with left lower leg and foot swelling and pain.  Lower extremity venous Dopplers negative for DVT.  Clinically consistent with a cellulitis per ED physician.  Of note, patient had right leg cellulitis, completed Bactrim treatment approximately 10 days ago.  Patient was found to have WBC 14.7, electrolytes renal function okay, temperature 99, no tachycardia, no tachypnea, oxygen saturation 98% on room air.  Patient is placed on MedSurg bed for observation.  Vancomycin and Rocephin were ordered by ED.   Please call manager of [facility] at [phone] when pt arrives to floor   Lorretta Harp, MD  [facility] Pager [phone]  If 7PM-7AM, please contact night-coverage [email or web address] Password [facility] 10/23/2018, 9:49 PM

## 2018-10-23 NOTE — ED Triage Notes (Signed)
L foot swelling, pain and warmth since yesterday. + pedal pulse. Denies injury.

## 2018-10-23 NOTE — ED Notes (Signed)
ED Provider at bedside. 

## 2018-10-23 NOTE — ED Provider Notes (Signed)
[facility] EMERGENCY DEPARTMENT Provider Note   CSN: [id number] Arrival date & time: 10/23/18  1536     History   Chief Complaint Chief Complaint  Patient presents with  . Leg Swelling    HPI Nathaniel Cervantes is a 62 y.o. male.  HPI Reports he started to get pain and swelling in the foot yesterday.  He reports today the leg and foot are much more swollen.  It now hurts quite a bit to try to walk on the foot.  No known injury.  No prior history of DVT.  She reports she had some fire ant bites on both legs about 3 weeks ago.  Had to take Bactrim for about 2 weeks for the swelling and redness on the right lower extremity.  That improved.  Since then the left lower extremity started suddenly swelling and getting very painful with no known reason.  No other associated symptoms. Past Medical History:  Diagnosis Date  . Acid reflux   . Hypertension   . Kidney stones, calcium oxalate     Patient Active Problem List   Diagnosis Date Noted  . Left leg cellulitis 10/23/2018    Past Surgical History:  Procedure Laterality Date  . APPENDECTOMY    . ORTHOPEDIC SURGERY          Home Medications    Prior to Admission medications   Medication Sig Start Date End Date Taking? Authorizing Provider  EPINEPHrine (EPIPEN) 0.3 mg/0.3 mL IJ SOAJ injection Inject 0.3 mLs (0.3 mg total) into the muscle as needed. 07/24/14  Yes Neva Seat, Tiffany, PA-C  bisoprolol-hydrochlorothiazide [facility]) 10-6.25 MG tablet Take 1 tablet by mouth daily.    [provider]  metoprolol tartrate (LOPRESSOR) 25 MG tablet  10/14/18   [provider]  Multiple Vitamin (MULTI-VITAMINS) TABS Take by mouth.    [provider]  Omega-3 1000 MG CAPS Take by mouth.    [provider]  omeprazole (PRILOSEC) 40 MG capsule  10/14/18   [provider]  ondansetron (ZOFRAN-ODT) 8 MG disintegrating tablet Take 1 tablet (8 mg total) by mouth every 8 (eight) hours as needed for  nausea or vomiting. 06/16/16   Benjiman Core, MD  oxyCODONE-acetaminophen (PERCOCET) 5-325 MG tablet Take 1-2 tablets by mouth every 6 (six) hours as needed. 03/29/17   Geoffery Lyons, MD  tamsulosin (FLOMAX) 0.4 MG CAPS capsule Take 1 capsule (0.4 mg total) by mouth daily. 03/29/17   Geoffery Lyons, MD  traMADol (ULTRAM) 50 MG tablet Take 50 mg by mouth every 6 (six) hours as needed.    [provider]  triamterene-hydrochlorothiazide Ilsa Iha) 37.5-25 MG capsule  10/14/18   [provider]    Family History No family history on file.  Social History Social History   Tobacco Use  . Smoking status: Current Every Day Smoker  . Smokeless tobacco: Never Used  Substance Use Topics  . Alcohol use: Yes    Comment: occ  . Drug use: No     Allergies   Patient has no known allergies.   Review of Systems Review of Systems 10 Systems reviewed and are negative for acute change except as noted in the HPI.  Physical Exam Updated Vital Signs BP 129/78 (BP Location: Right Arm)   Pulse 82   Temp 99 F (37.2 C) (Oral)   Resp 18   Ht 6\' 3"  (1.905 m)   Wt 95.3 kg   SpO2 98%   BMI 26.25 kg/m   Physical Exam  Constitutional: He is oriented to person, place, and time. He appears well-developed and well-nourished. No distress.  HENT:  Head: Normocephalic and atraumatic.  Eyes: EOM are normal.  Cardiovascular: Normal rate, regular rhythm, normal heart sounds and intact distal pulses.  Pulmonary/Chest: Effort normal and breath sounds normal.  Abdominal: Soft. He exhibits no distension. There is no tenderness. There is no guarding.  Musculoskeletal:  3+ pitting edema of the right foot and lower leg.  All of the foot is tender but no palpable areas of fluctuance.  No Wounds visible.  Pitting edema at the ankle of the right foot.  Lower leg and foot however are nontender.  Neurological: He is alert and oriented to person, place, and time. He exhibits normal muscle tone.  Coordination normal.  Skin: Skin is warm and dry.  Psychiatric: He has a normal mood and affect.           ED Treatments / Results  Labs (all labs ordered are listed, but only abnormal results are displayed) Labs Reviewed  BASIC METABOLIC PANEL - Abnormal; Notable for the following components:      Result Value   Chloride 96 (*)    Glucose, Bld 112 (*)    All other components within normal limits  CBC WITH DIFFERENTIAL/PLATELET - Abnormal; Notable for the following components:   WBC 14.7 (*)    Neutro Abs 10.8 (*)    Monocytes Absolute 1.1 (*)    Abs Immature Granulocytes 0.13 (*)    All other components within normal limits  CULTURE, BLOOD (ROUTINE X 2)  CULTURE, BLOOD (ROUTINE X 2)    EKG None  Radiology US Venous Img Lower Bilateral  Result Date: 10/23/2018 CLINICAL DATA:  Leg pain and swelling EXAM: BILATERAL LOWER EXTREMITY VENOUS DOPPLER ULTRASOUND TECHNIQUE: Gray-scale sonography with graded compression, as well as color Doppler and duplex ultrasound were performed to evaluate the lower extremity deep venous systems from the level of the common femoral vein and including the common femoral, femoral, profunda femoral, popliteal and calf veins including the posterior tibial, peroneal and gastrocnemius veins when visible. The superficial great saphenous vein was also interrogated. Spectral Doppler was utilized to evaluate flow at rest and with distal augmentation maneuvers in the common femoral, femoral and popliteal veins. COMPARISON:  None. FINDINGS: RIGHT LOWER EXTREMITY Common Femoral Vein: No evidence of thrombus. Normal compressibility, respiratory phasicity and response to augmentation. Saphenofemoral Junction: No evidence of thrombus. Normal compressibility and flow on color Doppler imaging. Profunda Femoral Vein: No evidence of thrombus. Normal compressibility and flow on color Doppler imaging. Femoral Vein: No evidence of thrombus. Normal compressibility,  respiratory phasicity and response to augmentation. Popliteal Vein: No evidence of thrombus. Normal compressibility, respiratory phasicity and response to augmentation. Calf Veins: No evidence of thrombus. Normal compressibility and flow on color Doppler imaging. Superficial Great Saphenous Vein: No evidence of thrombus. Normal compressibility. Venous Reflux:  None. Other Findings:  None. LEFT LOWER EXTREMITY Common Femoral Vein: No evidence of thrombus. Normal compressibility, respiratory phasicity and response to augmentation. Saphenofemoral Junction: No evidence of thrombus. Normal compressibility and flow on color Doppler imaging. Profunda Femoral Vein: No evidence of thrombus. Normal compressibility and flow on color Doppler imaging. Femoral Vein: No evidence of thrombus. Normal compressibility, respiratory phasicity and response to augmentation. Popliteal Vein: No evidence of thrombus. Normal compressibility, respiratory phasicity and response to augmentation. Calf Veins: No evidence of thrombus. Normal compressibility and flow on color Doppler imaging. Superficial Great Saphenous Vein: No evidence of thrombus. Normal compressibility.  Venous Reflux:  None. Other Findings:  None. IMPRESSION: No evidence of deep venous thrombosis. Electronically Signed   By: Alcide Clever M.D.   On: 10/23/2018 20:24    Procedures Procedures (including critical care time)  Medications Ordered in ED Medications  cefTRIAXone (ROCEPHIN) 1 g in sodium chloride 0.9 % 100 mL IVPB (1 g Intravenous New Bag/Given 10/23/18 2149)  vancomycin (VANCOCIN) IVPB 1000 mg/200 mL premix (has no administration in time range)  0.9 %  sodium chloride infusion (500 mLs Intravenous New Bag/Given 10/23/18 2146)  morphine 4 MG/ML injection 4 mg (4 mg Intravenous Given 10/23/18 2146)     Initial Impression / Assessment and Plan / ED Course  I have reviewed the triage vital signs and the nursing notes.  Pertinent labs & imaging results that  were available during my care of the patient were reviewed by me and considered in my medical decision making (see chart for details).     Consult: Reviewed with Dr. Youlanda Roys for admission.  DVT ruled out.  Patient has 2+ pedal pulses bilaterally.  SPECT cellulitis.  She has severe pain with walking on the foot.  However, it is not exquisitely tender to light touch to suggest gout.  Had been on Bactrim up until about a week ago.  At this time with leukocytosis, very large swelling and fairly severe foot pain, plan for admission.  Final Clinical Impressions(s) / ED Diagnoses   Final diagnoses:  Leg swelling  Cellulitis of left lower extremity    ED Discharge Orders    None       Arby Barrette, MD 10/23/18 2159

## 2018-10-23 NOTE — ED Notes (Signed)
[facility] notified (Doug) - patient ready for transport 

## 2018-10-24 ENCOUNTER — Encounter (HOSPITAL_COMMUNITY): Payer: Self-pay | Admitting: Internal Medicine

## 2018-10-24 ENCOUNTER — Other Ambulatory Visit: Payer: Self-pay

## 2018-10-24 DIAGNOSIS — Z720 Tobacco use: Secondary | ICD-10-CM | POA: Diagnosis present

## 2018-10-24 DIAGNOSIS — K219 Gastro-esophageal reflux disease without esophagitis: Secondary | ICD-10-CM | POA: Diagnosis not present

## 2018-10-24 DIAGNOSIS — Z79899 Other long term (current) drug therapy: Secondary | ICD-10-CM | POA: Diagnosis not present

## 2018-10-24 DIAGNOSIS — Z888 Allergy status to other drugs, medicaments and biological substances status: Secondary | ICD-10-CM | POA: Diagnosis not present

## 2018-10-24 DIAGNOSIS — F17200 Nicotine dependence, unspecified, uncomplicated: Secondary | ICD-10-CM | POA: Diagnosis not present

## 2018-10-24 DIAGNOSIS — L03116 Cellulitis of left lower limb: Secondary | ICD-10-CM | POA: Diagnosis present

## 2018-10-24 DIAGNOSIS — I10 Essential (primary) hypertension: Secondary | ICD-10-CM | POA: Diagnosis present

## 2018-10-24 DIAGNOSIS — Z23 Encounter for immunization: Secondary | ICD-10-CM | POA: Diagnosis not present

## 2018-10-24 DIAGNOSIS — K625 Hemorrhage of anus and rectum: Secondary | ICD-10-CM | POA: Diagnosis present

## 2018-10-24 LAB — BASIC METABOLIC PANEL
ANION GAP: 12 (ref 5–15)
BUN: 14 mg/dL (ref 8–23)
CO2: 25 mmol/L (ref 22–32)
Calcium: 9 mg/dL (ref 8.9–10.3)
Chloride: 100 mmol/L (ref 98–111)
Creatinine, Ser: 0.98 mg/dL (ref 0.61–1.24)
GFR calc Af Amer: >60 mL/min (ref >60)
Glucose, Bld: 125 mg/dL — ABNORMAL HIGH (ref 70–99)
POTASSIUM: 3.3 mmol/L — AB (ref 3.5–5.1)
Sodium: 137 mmol/L (ref 135–145)

## 2018-10-24 LAB — CBC
HCT: 39.9 % (ref 39.0–52.0)
HEMOGLOBIN: 13 g/dL (ref 13.0–17.0)
MCH: 29.7 pg (ref 26.0–34.0)
MCHC: 32.6 g/dL (ref 30.0–36.0)
MCV: 91.1 fL (ref 80.0–100.0)
NRBC: 0 % (ref 0.0–0.2)
Platelets: 316 10*3/uL (ref 150–400)
RBC: 4.38 MIL/uL (ref 4.22–5.81)
RDW: 13.7 % (ref 11.5–15.5)
WBC: 13.1 10*3/uL — ABNORMAL HIGH (ref 4.0–10.5)

## 2018-10-24 LAB — C-REACTIVE PROTEIN: CRP: 9.1 mg/dL — AB (ref <1.0)

## 2018-10-24 LAB — SEDIMENTATION RATE: SED RATE: 42 mm/h — AB (ref 0–16)

## 2018-10-24 LAB — HIV ANTIBODY (ROUTINE TESTING W REFLEX): HIV SCREEN 4TH GENERATION: NONREACTIVE

## 2018-10-24 MED ORDER — ZOLPIDEM TARTRATE 5 MG PO TABS: 5.0000 mg | ORAL_TABLET | Freq: Every evening | ORAL | Status: DC | PRN

## 2018-10-24 MED ORDER — EPINEPHRINE 0.3 MG/0.3ML IJ SOAJ
0.3000 mg | INTRAMUSCULAR | Status: DC | PRN
  Filled 2018-10-24: qty 0.3

## 2018-10-24 MED ORDER — OMEGA-3-ACID ETHYL ESTERS 1 G PO CAPS
1000.0000 mg | ORAL_CAPSULE | Freq: Every day | ORAL | Status: DC
  Administered 2018-10-24 – 2018-10-25 (×2): 1000 mg via ORAL
  Filled 2018-10-24 (×2): qty 1

## 2018-10-24 MED ORDER — HYDRALAZINE HCL 20 MG/ML IJ SOLN: 5.0000 mg | INTRAMUSCULAR | Status: DC | PRN

## 2018-10-24 MED ORDER — PANTOPRAZOLE SODIUM 40 MG PO TBEC
40.0000 mg | DELAYED_RELEASE_TABLET | Freq: Every day | ORAL | Status: DC
  Administered 2018-10-24 – 2018-10-25 (×2): 40 mg via ORAL
  Filled 2018-10-24 (×2): qty 1

## 2018-10-24 MED ORDER — TAMSULOSIN HCL 0.4 MG PO CAPS
0.4000 mg | ORAL_CAPSULE | Freq: Every day | ORAL | Status: DC
  Administered 2018-10-24 – 2018-10-25 (×2): 0.4 mg via ORAL
  Filled 2018-10-24 (×2): qty 1

## 2018-10-24 MED ORDER — SODIUM CHLORIDE 0.9 % IV SOLN: INTRAVENOUS | Status: DC

## 2018-10-24 MED ORDER — INFLUENZA VAC SPLIT QUAD 0.5 ML IM SUSY
0.5000 mL | PREFILLED_SYRINGE | INTRAMUSCULAR | Status: AC
  Administered 2018-10-25: 0.5 mL via INTRAMUSCULAR
  Filled 2018-10-24: qty 0.5

## 2018-10-24 MED ORDER — HYDROMORPHONE HCL 1 MG/ML IJ SOLN
INTRAMUSCULAR | Status: AC
  Administered 2018-10-24: 1 mg via INTRAVENOUS
  Filled 2018-10-24: qty 1

## 2018-10-24 MED ORDER — VANCOMYCIN HCL 10 G IV SOLR
1250.0000 mg | Freq: Two times a day (BID) | INTRAVENOUS | Status: DC
  Administered 2018-10-24 – 2018-10-25 (×3): 1250 mg via INTRAVENOUS
  Filled 2018-10-24 (×5): qty 1250

## 2018-10-24 MED ORDER — ONDANSETRON HCL 4 MG/2ML IJ SOLN: 4.0000 mg | Freq: Three times a day (TID) | INTRAMUSCULAR | Status: DC | PRN

## 2018-10-24 MED ORDER — SENNOSIDES-DOCUSATE SODIUM 8.6-50 MG PO TABS: 1.0000 | ORAL_TABLET | Freq: Every evening | ORAL | Status: DC | PRN

## 2018-10-24 MED ORDER — ADULT MULTIVITAMIN W/MINERALS CH
1.0000 | ORAL_TABLET | Freq: Every day | ORAL | Status: DC
  Administered 2018-10-24 – 2018-10-25 (×2): 1 via ORAL
  Filled 2018-10-24 (×2): qty 1

## 2018-10-24 MED ORDER — METOPROLOL TARTRATE 25 MG PO TABS
25.0000 mg | ORAL_TABLET | Freq: Two times a day (BID) | ORAL | Status: DC
  Administered 2018-10-24 – 2018-10-25 (×4): 25 mg via ORAL
  Filled 2018-10-24 (×4): qty 1

## 2018-10-24 MED ORDER — OXYCODONE-ACETAMINOPHEN 5-325 MG PO TABS
1.0000 | ORAL_TABLET | Freq: Four times a day (QID) | ORAL | Status: DC | PRN
  Administered 2018-10-24 – 2018-10-25 (×6): 1 via ORAL
  Filled 2018-10-24 (×7): qty 1

## 2018-10-24 MED ORDER — ENOXAPARIN SODIUM 40 MG/0.4ML ~~LOC~~ SOLN
40.0000 mg | Freq: Every day | SUBCUTANEOUS | Status: DC
  Administered 2018-10-24 – 2018-10-25 (×2): 40 mg via SUBCUTANEOUS
  Filled 2018-10-24 (×2): qty 0.4

## 2018-10-24 MED ORDER — ACETAMINOPHEN 325 MG PO TABS: 650.0000 mg | ORAL_TABLET | Freq: Four times a day (QID) | ORAL | Status: DC | PRN

## 2018-10-24 MED ORDER — SODIUM CHLORIDE 0.9 % IV SOLN
1.0000 g | INTRAVENOUS | Status: DC
  Administered 2018-10-24: 1 g via INTRAVENOUS
  Filled 2018-10-24: qty 10
  Filled 2018-10-24: qty 1

## 2018-10-24 MED ORDER — NICOTINE 21 MG/24HR TD PT24
21.0000 mg | MEDICATED_PATCH | Freq: Every day | TRANSDERMAL | Status: DC
  Administered 2018-10-24 – 2018-10-25 (×2): 21 mg via TRANSDERMAL
  Filled 2018-10-24 (×2): qty 1

## 2018-10-24 MED ORDER — SODIUM CHLORIDE 0.9 % IV BOLUS
1000.0000 mL | Freq: Once | INTRAVENOUS | Status: AC
  Administered 2018-10-24: 1000 mL via INTRAVENOUS

## 2018-10-24 MED ORDER — HYDROMORPHONE HCL 1 MG/ML IJ SOLN
1.0000 mg | INTRAMUSCULAR | Status: DC | PRN
  Administered 2018-10-24: 1 mg via INTRAVENOUS
  Filled 2018-10-24: qty 1

## 2018-10-24 NOTE — Progress Notes (Signed)
Patient seen and examined. Database reviewed. No family members at bedside. Patient admitted earlier today for LLE cellulitis. Cx remain negative, he remains afebrile. Plan to continue IV abx tonight with plans to transition to PO in am and DC home as long as clinical condition remains stable. Will continue to follow tonight.  Peggye Pitt, MD [facility] Pager: [phone]

## 2018-10-24 NOTE — H&P (Signed)
History and Physical    Nathaniel Cervantes YFV:[id number] DOB: 1956/02/13 DOA: 10/23/2018  Referring MD/NP/PA:   PCP: [facility]   Patient coming from:  The patient is coming from home.  At baseline, pt is independent for most of ADL.        Chief Complaint: left foot and lower leg pain  HPI: Nathaniel Cervantes is a 62 y.o. male with medical history significant of hypertension, GERD, tobacco abuse, kidney stone, who presents with left lower leg and foot swelling and pain.   Pt states that he started having left foot and lower leg pain and swelling since yesterday, which has been progressively getting worse.  The pain is constant, 9 out of 10 severity, sharp, nonradiating.  No injury.  Patient does not have fever or chills.  Patient denies chest pain, shortness breath, cough.  No nausea, vomiting, diarrhea or abdominal pain, no symptoms of UTI or unilateral weakness.  Of note, patient states that recently he had right leg cellulitis, and completed course of Bactrim and resolved completely.  ED Course: pt was found to have  WBC 14.7, electrolytes renal function okay, temperature 99, no tachycardia, no tachypnea, oxygen saturation 98% on room air.  Lower extremity venous Dopplers negative for DVT.  Patient is placed on MedSurg bed for observation.  Review of Systems:   General: no fevers, chills, no body weight gain, has fatigue HEENT: no blurry vision, hearing changes or sore throat Respiratory: no dyspnea, coughing, wheezing CV: no chest pain, no palpitations GI: no nausea, vomiting, abdominal pain, diarrhea, constipation GU: no dysuria, burning on urination, increased urinary frequency, hematuria  Ext: Has left lower leg and foot swelling and pain Neuro: no unilateral weakness, numbness, or tingling, no vision change or hearing loss Skin: no rash, no skin tear. MSK: No muscle spasm, no deformity, no limitation of range of movement in spin Heme: No easy bruising.    Travel history: No recent long distant travel.  Allergy:  Allergies  Allergen Reactions  . Lisinopril Other (See Comments)    headache Cluster Headache   . Losartan Other (See Comments)    headaches Cluster Headache     Past Medical History:  Diagnosis Date  . Acid reflux   . Hypertension   . Kidney stones, calcium oxalate     Past Surgical History:  Procedure Laterality Date  . APPENDECTOMY    . ORTHOPEDIC SURGERY      Social History:  reports that he has been smoking. He has never used smokeless tobacco. He reports that he drinks alcohol. He reports that he does not use drugs.  Family History: No family history on file.   Prior to Admission medications   Medication Sig Start Date End Date Taking? Authorizing Provider  EPINEPHrine (EPIPEN) 0.3 mg/0.3 mL IJ SOAJ injection Inject 0.3 mLs (0.3 mg total) into the muscle as needed. 07/24/14  Yes Carlota Raspberry, Tiffany, PA-C  metoprolol tartrate (LOPRESSOR) 25 MG tablet Take 25 mg by mouth 2 (two) times daily.  10/14/18  Yes [provider]  Multiple Vitamin (MULTI-VITAMINS) TABS Take 1 tablet by mouth daily.    Yes [provider]  Omega-3 1000 MG CAPS Take 1,000 mg by mouth daily.    Yes [provider]  omeprazole (PRILOSEC) 40 MG capsule Take 40 mg by mouth daily.  10/14/18  Yes [provider]  triamterene-hydrochlorothiazide (DYAZIDE) 37.5-25 MG capsule Take 1 capsule by mouth daily.  10/14/18  Yes [provider]  bisoprolol-hydrochlorothiazide [facility])  10-6.25 MG tablet Take 1 tablet by mouth daily.    [provider]  ondansetron (ZOFRAN-ODT) 8 MG disintegrating tablet Take 1 tablet (8 mg total) by mouth every 8 (eight) hours as needed for nausea or vomiting. 06/16/16   Davonna Belling, MD  oxyCODONE-acetaminophen (PERCOCET) 5-325 MG tablet Take 1-2 tablets by mouth every 6 (six) hours as needed. 03/29/17   Veryl Speak, MD  tamsulosin (FLOMAX) 0.4 MG CAPS capsule Take 1  capsule (0.4 mg total) by mouth daily. 03/29/17   Veryl Speak, MD  traMADol (ULTRAM) 50 MG tablet Take 50 mg by mouth every 6 (six) hours as needed.    [provider]    Physical Exam: Vitals:   10/24/18 0030 10/24/18 0045 10/24/18 0140 10/24/18 0541  BP: 109/81  140/76 (!) 146/77  Pulse: 100 91 79 76  Resp:   16 18  Temp:    97.9 F (36.6 C)  TempSrc:    Oral  SpO2: 95%  97% 99%  Weight:      Height:       General: Not in acute distress HEENT:       Eyes: PERRL, EOMI, no scleral icterus.       ENT: No discharge from the ears and nose, no pharynx injection, no tonsillar enlargement.        Neck: No JVD, no bruit, no mass felt. Heme: No neck lymph node enlargement. Cardiac: S1/S2, RRR, No murmurs, No gallops or rubs. Respiratory: Good air movement bilaterally. No rales, wheezing, rhonchi or rubs. GI: Soft, nondistended, nontender, no rebound pain, no organomegaly, BS present. GU: No hematuria Ext:  2+DP/PT pulse bilaterally.  Left foot and left lower leg slightly above ankle area are swelling, tender, warm and erythematous. Musculoskeletal: No joint deformities, No joint redness or warmth, no limitation of ROM in spin. Skin: No rashes.  Neuro: Alert, oriented X3, cranial nerves II-XII grossly intact, moves all extremities normally.  Psych: Patient is not psychotic, no suicidal or hemocidal ideation.  Labs on Admission: I have personally reviewed following labs and imaging studies  CBC: Recent Labs  Lab 10/23/18 2013 10/24/18 0330  WBC 14.7* 13.1*  NEUTROABS 10.8*  --   HGB 14.1 13.0  HCT 42.4 39.9  MCV 89.8 91.1  PLT 312 329   Basic Metabolic Panel: Recent Labs  Lab 10/23/18 2013 10/24/18 0330  NA 135 137  K 3.5 3.3*  CL 96* 100  CO2 26 25  GLUCOSE 112* 125*  BUN 15 14  CREATININE 1.01 0.98  CALCIUM 9.5 9.0   GFR: Estimated Creatinine Clearance: 94.6 mL/min (by C-G formula based on SCr of 0.98 mg/dL). Liver Function Tests: No results for  input(s): AST, ALT, ALKPHOS, BILITOT, PROT, ALBUMIN in the last 168 hours. No results for input(s): LIPASE, AMYLASE in the last 168 hours. No results for input(s): AMMONIA in the last 168 hours. Coagulation Profile: No results for input(s): INR, PROTIME in the last 168 hours. Cardiac Enzymes: No results for input(s): CKTOTAL, CKMB, CKMBINDEX, TROPONINI in the last 168 hours. BNP (last 3 results) No results for input(s): PROBNP in the last 8760 hours. HbA1C: No results for input(s): HGBA1C in the last 72 hours. CBG: No results for input(s): GLUCAP in the last 168 hours. Lipid Profile: No results for input(s): CHOL, HDL, LDLCALC, TRIG, CHOLHDL, LDLDIRECT in the last 72 hours. Thyroid Function Tests: No results for input(s): TSH, T4TOTAL, FREET4, T3FREE, THYROIDAB in the last 72 hours. Anemia Panel: No results for input(s): VITAMINB12, FOLATE,  FERRITIN, TIBC, IRON, RETICCTPCT in the last 72 hours. Urine analysis:    Component Value Date/Time   COLORURINE YELLOW 03/29/2017 0134   APPEARANCEUR CLEAR 03/29/2017 0134   LABSPEC 1.017 03/29/2017 0134   PHURINE 6.5 03/29/2017 0134   GLUCOSEU NEGATIVE 03/29/2017 0134   HGBUR LARGE (A) 03/29/2017 0134   BILIRUBINUR NEGATIVE 03/29/2017 0134   KETONESUR NEGATIVE 03/29/2017 0134   PROTEINUR NEGATIVE 03/29/2017 0134   NITRITE NEGATIVE 03/29/2017 0134   LEUKOCYTESUR NEGATIVE 03/29/2017 0134   Sepsis Labs: @LABRCNTIP (procalcitonin:4,lacticidven:4) )No results found for this or any previous visit (from the past 240 hour(s)).   Radiological Exams on Admission: US Venous Img Lower Bilateral  Result Date: 10/23/2018 CLINICAL DATA:  Leg pain and swelling EXAM: BILATERAL LOWER EXTREMITY VENOUS DOPPLER ULTRASOUND TECHNIQUE: Gray-scale sonography with graded compression, as well as color Doppler and duplex ultrasound were performed to evaluate the lower extremity deep venous systems from the level of the common femoral vein and including the common  femoral, femoral, profunda femoral, popliteal and calf veins including the posterior tibial, peroneal and gastrocnemius veins when visible. The superficial great saphenous vein was also interrogated. Spectral Doppler was utilized to evaluate flow at rest and with distal augmentation maneuvers in the common femoral, femoral and popliteal veins. COMPARISON:  None. FINDINGS: RIGHT LOWER EXTREMITY Common Femoral Vein: No evidence of thrombus. Normal compressibility, respiratory phasicity and response to augmentation. Saphenofemoral Junction: No evidence of thrombus. Normal compressibility and flow on color Doppler imaging. Profunda Femoral Vein: No evidence of thrombus. Normal compressibility and flow on color Doppler imaging. Femoral Vein: No evidence of thrombus. Normal compressibility, respiratory phasicity and response to augmentation. Popliteal Vein: No evidence of thrombus. Normal compressibility, respiratory phasicity and response to augmentation. Calf Veins: No evidence of thrombus. Normal compressibility and flow on color Doppler imaging. Superficial Great Saphenous Vein: No evidence of thrombus. Normal compressibility. Venous Reflux:  None. Other Findings:  None. LEFT LOWER EXTREMITY Common Femoral Vein: No evidence of thrombus. Normal compressibility, respiratory phasicity and response to augmentation. Saphenofemoral Junction: No evidence of thrombus. Normal compressibility and flow on color Doppler imaging. Profunda Femoral Vein: No evidence of thrombus. Normal compressibility and flow on color Doppler imaging. Femoral Vein: No evidence of thrombus. Normal compressibility, respiratory phasicity and response to augmentation. Popliteal Vein: No evidence of thrombus. Normal compressibility, respiratory phasicity and response to augmentation. Calf Veins: No evidence of thrombus. Normal compressibility and flow on color Doppler imaging. Superficial Great Saphenous Vein: No evidence of thrombus. Normal  compressibility. Venous Reflux:  None. Other Findings:  None. IMPRESSION: No evidence of deep venous thrombosis. Electronically Signed   By: Inez Catalina M.D.   On: 10/23/2018 20:24     EKG:  Not done in ED.  Assessment/Plan Principal Problem:   Cellulitis of left foot Active Problems:   Left leg cellulitis   Acid reflux   Hypertension   Tobacco abuse   Cellulitis of left foot and lower leg: Patient has leukocytosis, no fever, clinically not septic.  Lower extremity venous Doppler is negative for DVT  - will place on tele bed for obs - Empiric antimicrobial treatment with vancomycin and Rocephin - PRN Zofran for nausea, Dilaudid and Percocet for pain - Blood cultures x 2  - ESR and CRP - will get Procalcitonin and trend lactic acid levels per sepsis protocol. - IVF: 1.0 L of NS bolus in ED, followed by 125 cc/h  GERD: -Protonix  HTN: Blood pressure 129/78 -Continue home medications: Metoprolol -Hold Dyazide and Ziac -IV  hydralazine prn  Tobacco abuse: -Did counseling about importance of quitting smoking -Nicotine patch   DVT ppx: SQ Lovenox Code Status: Full code Family Communication: None at bed side.    Disposition Plan:  Anticipate discharge back to previous home environment Consults called:  none Admission status:   medical floor/obs       Date of Service 10/24/2018    Ivor Costa [facility] Pager [phone]  If 7PM-7AM, please contact night-coverage [email or web address] Password [email or web address] 10/24/2018, 5:55 AM

## 2018-10-24 NOTE — Plan of Care (Signed)
Pt alert and oriented, wife at the bedside.  Complaints of pain in LLE, with pain and swelling, mild redness. IV abx given, RN will monitor.

## 2018-10-24 NOTE — Progress Notes (Signed)
Pharmacy Antibiotic Note  Nathaniel Cervantes is a 62 y.o. male admitted on 10/23/2018 with cellulitis.  Pharmacy has been consulted for vancomycin dosing.  Plan: Vancomycin 1 gm x1 then 1250 mg IV q12h for est AUC = 503 Goal AUC = 400-500 F/u scr/cultures/levels Rocephin 1 gm IV q24h (Md)  Height: 6\' 3"  (190.5 cm) Weight: 210 lb (95.3 kg) IBW/kg (Calculated) : 84.5  Temp (24hrs), Avg:98.5 F (36.9 C), Min:98.1 F (36.7 C), Max:99 F (37.2 C)  Recent Labs  Lab 10/23/18 2013  WBC 14.7*  CREATININE 1.01    Estimated Creatinine Clearance: 91.8 mL/min (by C-G formula based on SCr of 1.01 mg/dL).    Allergies  Allergen Reactions  . Lisinopril Other (See Comments)    headache Cluster Headache   . Losartan Other (See Comments)    headaches Cluster Headache     Antimicrobials this admission: 11/2 rocephin >>  11/2 vancomycin >>   Dose adjustments this admission:   Microbiology results:  BCx:   UCx:    Sputum:    MRSA PCR:   Thank you for allowing pharmacy to be a part of this patient's care.  Lorenza Evangelist 10/24/2018 2:33 AM

## 2018-10-24 NOTE — ED Notes (Signed)
Report given to Sharyn Lull, RN, [employer]

## 2018-10-25 MED ORDER — DOXYCYCLINE HYCLATE 100 MG PO CAPS: 100.0000 mg | ORAL_CAPSULE | Freq: Two times a day (BID) | ORAL | 0 refills | Status: AC

## 2018-10-25 NOTE — Discharge Summary (Signed)
Physician Discharge Summary  Nathaniel Cervantes ZOX:[id number] DOB: Jan 22, 1956 DOA: 10/23/2018  PCP: [facility]  Admit date: 10/23/2018 Discharge date: 10/25/2018  Time spent: 45 minutes  Recommendations for Outpatient Follow-up:  -To be discharged home today -Advise follow-up with PCP in 2 weeks. -We will be discharged with a 10-day course of doxycycline to treat his left lower extremity cellulitis.  Discharge Diagnoses:  Principal Problem:   Cellulitis of left foot Active Problems:   Left leg cellulitis   Acid reflux   Hypertension   Tobacco abuse   Rectal bleed   Discharge Condition: Stable and improved  Filed Weights   10/23/18 1544  Weight: 95.3 kg    History of present illness:  As per Dr. Clyde Lundborg on 11/3: Nathaniel Cervantes is a 62 y.o. male with medical history significant of hypertension, GERD, tobacco abuse, kidney stone, who presents with left lower leg and foot swellingandpain.   Pt states that he started having left foot and lower leg pain and swelling since yesterday, which has been progressively getting worse.  The pain is constant, 9 out of 10 severity, sharp, nonradiating.  No injury.  Patient does not have fever or chills.  Patient denies chest pain, shortness breath, cough.  No nausea, vomiting, diarrhea or abdominal pain, no symptoms of UTI or unilateral weakness.  Of note, patient states that recently he had right leg cellulitis, and completed course of Bactrim and resolved completely.  ED Course: pt was found to have  WBC 14.7, electrolytes renal function okay, temperature 99, no tachycardia, no tachypnea, oxygen saturation 98% on room air.  Lower extremity venous Dopplers negative for DVT. Patient is placed on MedSurg bedfor observation.  Hospital Course:   Cellulitis of left foot and left lower leg -Lower extremity Dopplers negative for DVT. -Was on vancomycin and Rocephin while hospitalized, antibiotics will be narrowed to  doxycycline for an additional 10 days on discharge. -Blood cultures remain negative to date, there has been some clinical improvement overnight as well.  Rest of chronic conditions have been stable during this short hospitalization  Procedures:  None  Consultations:  None  Discharge Instructions  Discharge Instructions    Diet - low sodium heart healthy   Complete by:  As directed    Increase activity slowly   Complete by:  As directed      Allergies as of 10/25/2018      Reactions   Lisinopril Other (See Comments)   headache Cluster Headache   Losartan Other (See Comments)   headaches Cluster Headache      Medication List    TAKE these medications   bisoprolol-hydrochlorothiazide 10-6.25 MG tablet Commonly known as:  ZIAC Take 1 tablet by mouth daily.   doxycycline 100 MG capsule Commonly known as:  VIBRAMYCIN Take 1 capsule (100 mg total) by mouth 2 (two) times daily for 10 days.   EPINEPHrine 0.3 mg/0.3 mL Soaj injection Commonly known as:  EPI-PEN Inject 0.3 mLs (0.3 mg total) into the muscle as needed.   metoprolol tartrate 25 MG tablet Commonly known as:  LOPRESSOR Take 25 mg by mouth 2 (two) times daily.   MULTI-VITAMINS Tabs Take 1 tablet by mouth daily.   Omega-3 1000 MG Caps Take 1,000 mg by mouth daily.   omeprazole 40 MG capsule Commonly known as:  PRILOSEC Take 40 mg by mouth daily.   ondansetron 8 MG disintegrating tablet Commonly known as:  ZOFRAN-ODT Take 1 tablet (8 mg total) by mouth every  8 (eight) hours as needed for nausea or vomiting.   oxyCODONE-acetaminophen 5-325 MG tablet Commonly known as:  PERCOCET/ROXICET Take 1-2 tablets by mouth every 6 (six) hours as needed.   tamsulosin 0.4 MG Caps capsule Commonly known as:  FLOMAX Take 1 capsule (0.4 mg total) by mouth daily.   traMADol 50 MG tablet Commonly known as:  ULTRAM Take 50 mg by mouth every 6 (six) hours as needed.   triamterene-hydrochlorothiazide 37.5-25 MG  capsule Commonly known as:  DYAZIDE Take 1 capsule by mouth daily.      Allergies  Allergen Reactions  . Lisinopril Other (See Comments)    headache Cluster Headache   . Losartan Other (See Comments)    headaches Cluster Headache    Follow-up Information    [facility]. Schedule an appointment as soon as possible for a visit in 2 week(s).   Specialty:  Family Medicine Contact information: [phone] [address] Darcel Smalling [address] [place] [address] [phone]            The results of significant diagnostics from this hospitalization (including imaging, microbiology, ancillary and laboratory) are listed below for reference.    Significant Diagnostic Studies: US Venous Img Lower Bilateral  Result Date: 10/23/2018 CLINICAL DATA:  Leg pain and swelling EXAM: BILATERAL LOWER EXTREMITY VENOUS DOPPLER ULTRASOUND TECHNIQUE: Gray-scale sonography with graded compression, as well as color Doppler and duplex ultrasound were performed to evaluate the lower extremity deep venous systems from the level of the common femoral vein and including the common femoral, femoral, profunda femoral, popliteal and calf veins including the posterior tibial, peroneal and gastrocnemius veins when visible. The superficial great saphenous vein was also interrogated. Spectral Doppler was utilized to evaluate flow at rest and with distal augmentation maneuvers in the common femoral, femoral and popliteal veins. COMPARISON:  None. FINDINGS: RIGHT LOWER EXTREMITY Common Femoral Vein: No evidence of thrombus. Normal compressibility, respiratory phasicity and response to augmentation. Saphenofemoral Junction: No evidence of thrombus. Normal compressibility and flow on color Doppler imaging. Profunda Femoral Vein: No evidence of thrombus. Normal compressibility and flow on color Doppler imaging. Femoral Vein: No evidence of thrombus. Normal compressibility, respiratory phasicity and response to  augmentation. Popliteal Vein: No evidence of thrombus. Normal compressibility, respiratory phasicity and response to augmentation. Calf Veins: No evidence of thrombus. Normal compressibility and flow on color Doppler imaging. Superficial Great Saphenous Vein: No evidence of thrombus. Normal compressibility. Venous Reflux:  None. Other Findings:  None. LEFT LOWER EXTREMITY Common Femoral Vein: No evidence of thrombus. Normal compressibility, respiratory phasicity and response to augmentation. Saphenofemoral Junction: No evidence of thrombus. Normal compressibility and flow on color Doppler imaging. Profunda Femoral Vein: No evidence of thrombus. Normal compressibility and flow on color Doppler imaging. Femoral Vein: No evidence of thrombus. Normal compressibility, respiratory phasicity and response to augmentation. Popliteal Vein: No evidence of thrombus. Normal compressibility, respiratory phasicity and response to augmentation. Calf Veins: No evidence of thrombus. Normal compressibility and flow on color Doppler imaging. Superficial Great Saphenous Vein: No evidence of thrombus. Normal compressibility. Venous Reflux:  None. Other Findings:  None. IMPRESSION: No evidence of deep venous thrombosis. Electronically Signed   By: Alcide Clever M.D.   On: 10/23/2018 20:24    Microbiology: No results found for this or any previous visit (from the past 240 hour(s)).   Labs: Basic Metabolic Panel: Recent Labs  Lab 10/23/18 2013 10/24/18 0330  NA 135 137  K 3.5 3.3*  CL 96* 100  CO2 26  25  GLUCOSE 112* 125*  BUN 15 14  CREATININE 1.01 0.98  CALCIUM 9.5 9.0   Liver Function Tests: No results for input(s): AST, ALT, ALKPHOS, BILITOT, PROT, ALBUMIN in the last 168 hours. No results for input(s): LIPASE, AMYLASE in the last 168 hours. No results for input(s): AMMONIA in the last 168 hours. CBC: Recent Labs  Lab 10/23/18 2013 10/24/18 0330  WBC 14.7* 13.1*  NEUTROABS 10.8*  --   HGB 14.1 13.0  HCT  42.4 39.9  MCV 89.8 91.1  PLT 312 316   Cardiac Enzymes: No results for input(s): CKTOTAL, CKMB, CKMBINDEX, TROPONINI in the last 168 hours. BNP: BNP (last 3 results) No results for input(s): BNP in the last 8760 hours.  ProBNP (last 3 results) No results for input(s): PROBNP in the last 8760 hours.  CBG: No results for input(s): GLUCAP in the last 168 hours.     Signed:  Chaya Jan  [facility] Pager: [phone] 10/25/2018, 1:25 PM

## 2018-10-29 LAB — CULTURE, BLOOD (ROUTINE X 2)
CULTURE: NO GROWTH
Culture: NO GROWTH
Special Requests: ADEQUATE
Special Requests: ADEQUATE

## 2018-11-09 IMAGING — US US EXTREM LOW VENOUS BILAT
1 series · 13 of 24 positions shown · non-contrast
Comparison: None.

CLINICAL DATA: Leg pain and swelling



[Series 1: us extrem low venous bilat · 0.08mm/px · 13 of 58 slices shown]
[im 1/58]
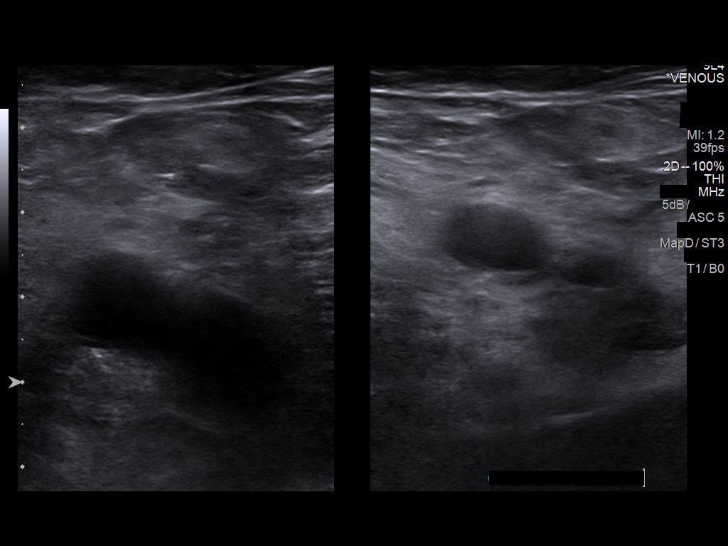
[im 5/58]
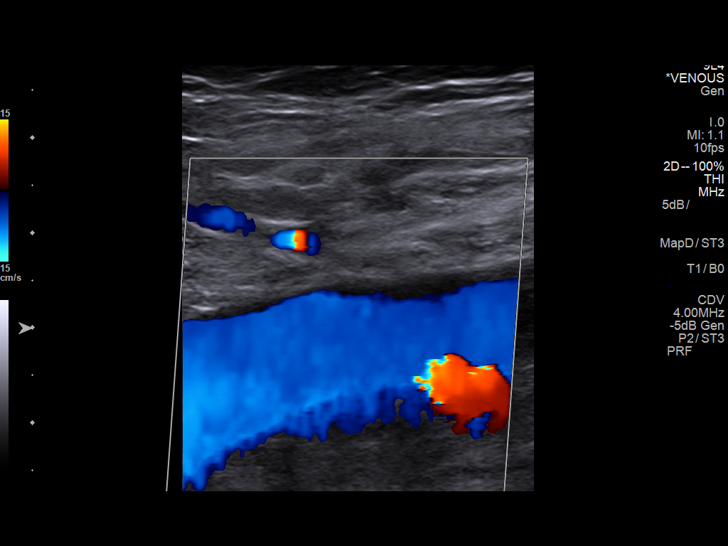
[im 10/58]
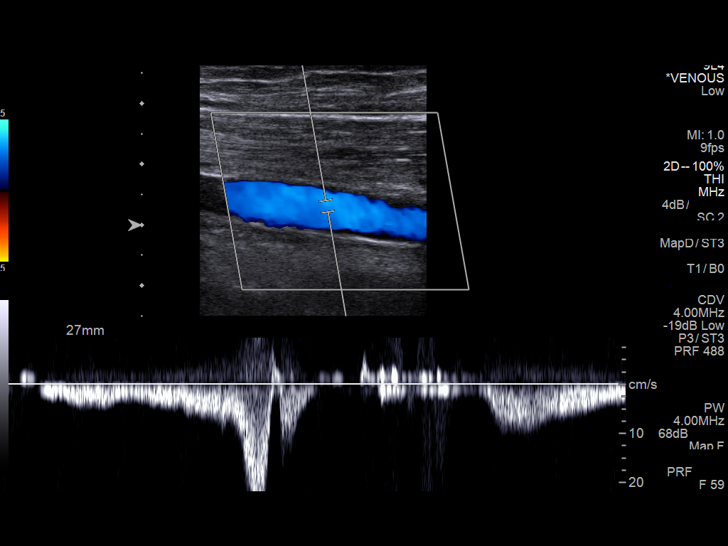
[im 15/58]
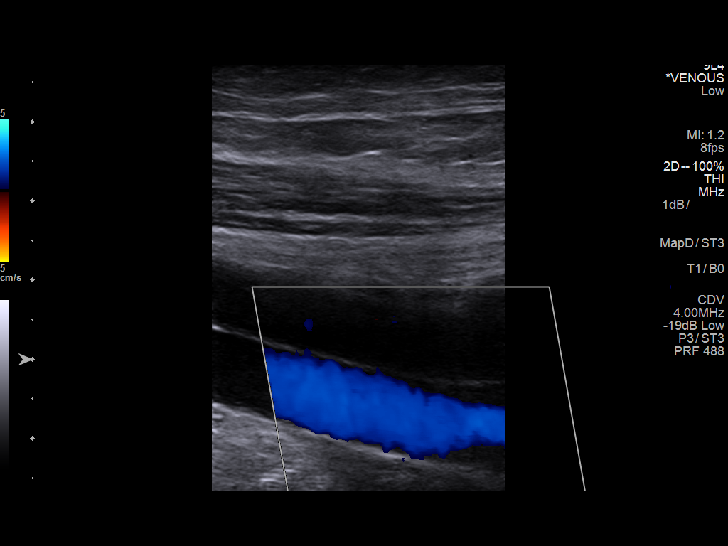
[im 20/58]
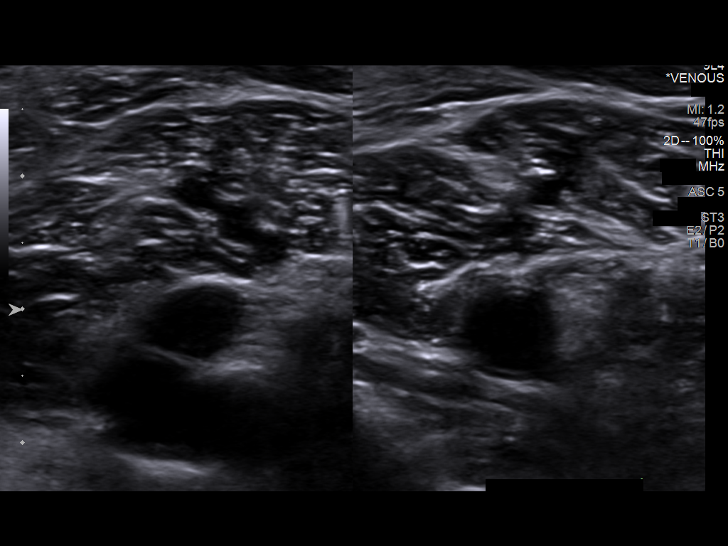
[im 25/58]
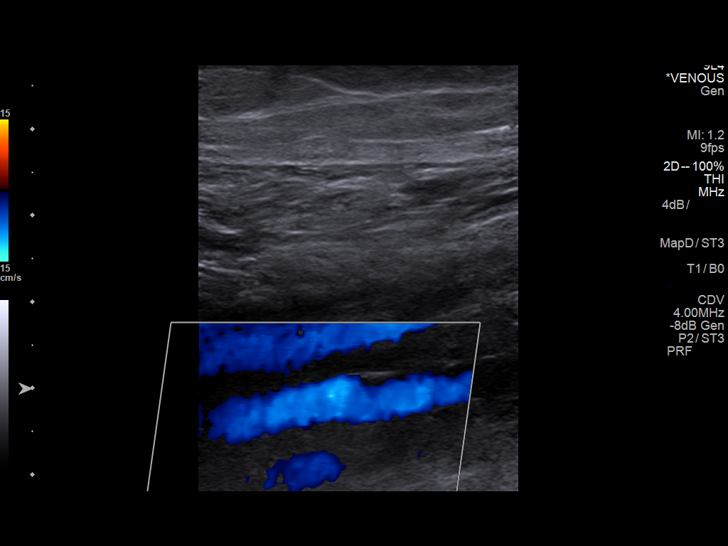
[im 30/58]
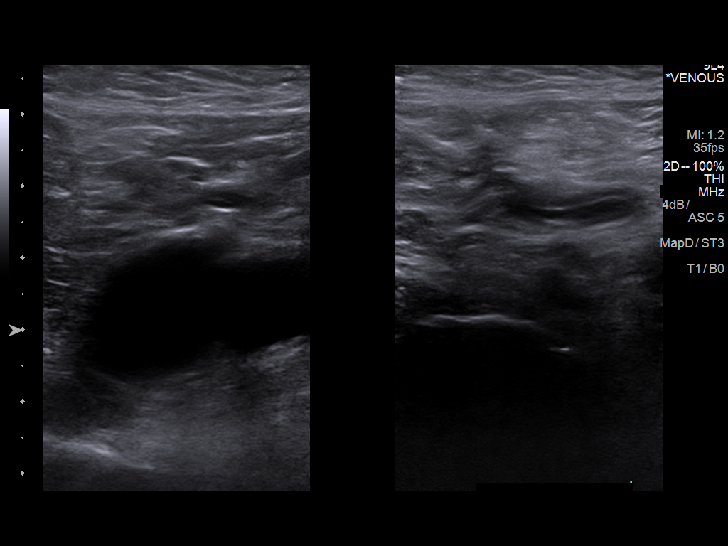
[im 33/58]
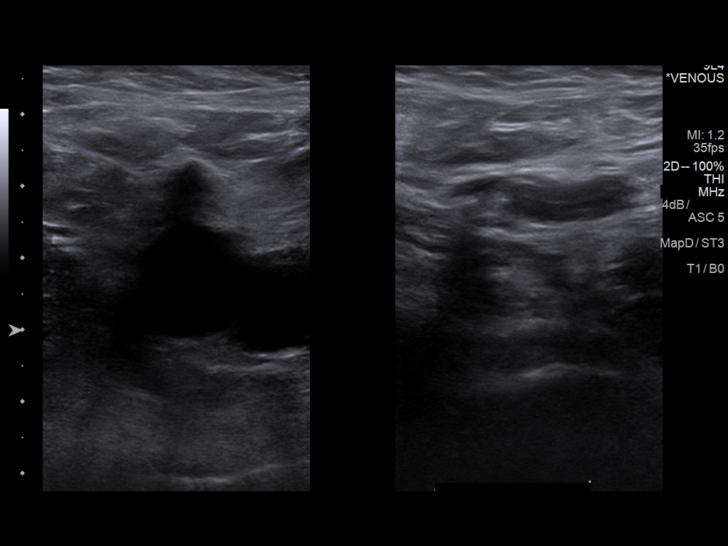
[im 38/58]
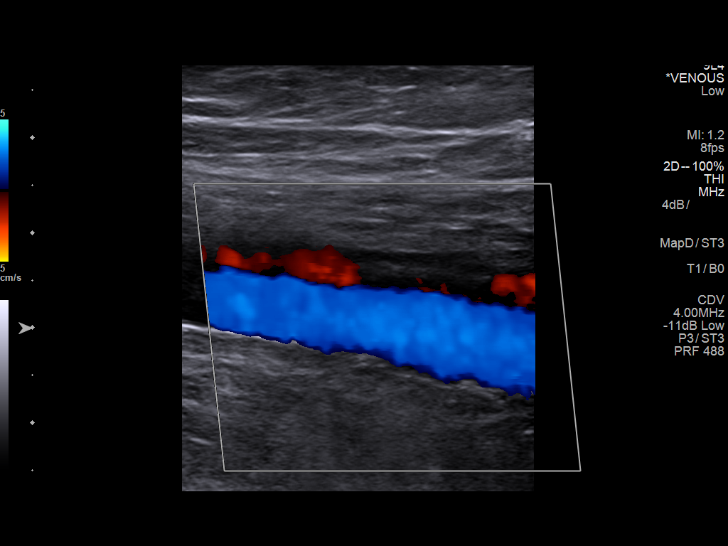
[im 43/58]
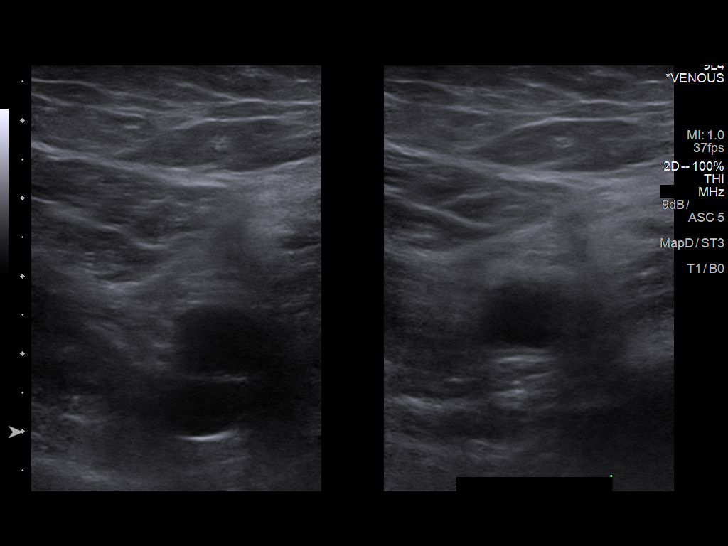
[im 48/58]
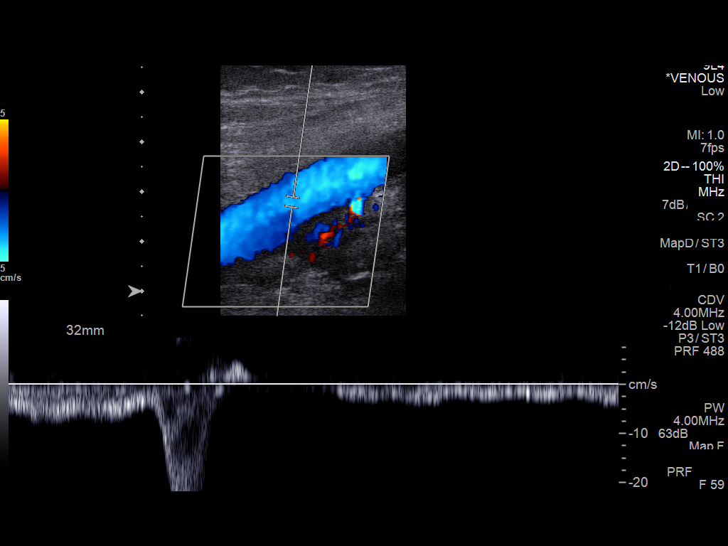
[im 53/58]
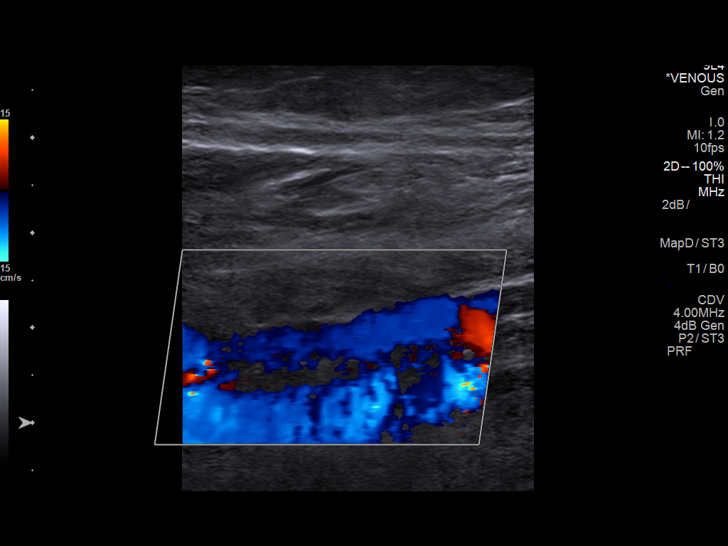
[im 58/58]
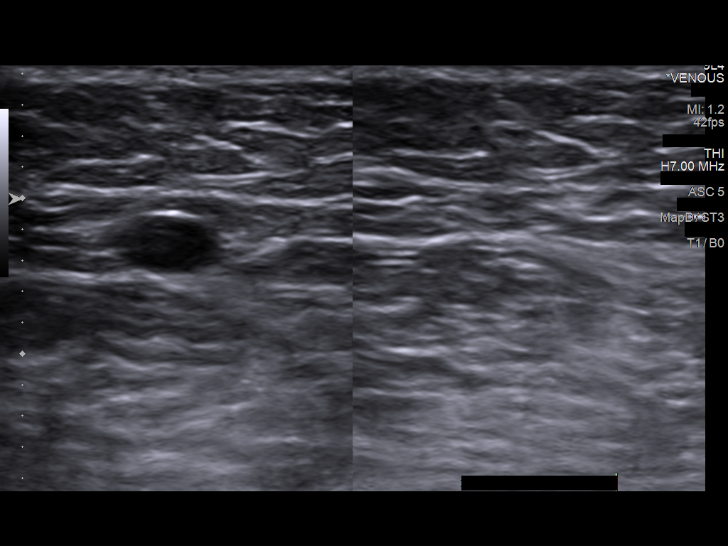

[13 of 24 positions shown; findings below may reference images not displayed]

FINDINGS: RIGHT LOWER EXTREMITY

Common Femoral Vein: No evidence of thrombus. Normal
compressibility, respiratory phasicity and response to augmentation.

Saphenofemoral Junction: No evidence of thrombus. Normal
compressibility and flow on color Doppler imaging.

Profunda Femoral Vein: No evidence of thrombus. Normal
compressibility and flow on color Doppler imaging.

Femoral Vein: No evidence of thrombus. Normal compressibility,
respiratory phasicity and response to augmentation.

Popliteal Vein: No evidence of thrombus. Normal compressibility,
respiratory phasicity and response to augmentation.

Calf Veins: No evidence of thrombus. Normal compressibility and flow
on color Doppler imaging.

Superficial Great Saphenous Vein: No evidence of thrombus. Normal
compressibility.

Venous Reflux:  None.

Other Findings:  None.

LEFT LOWER EXTREMITY

Common Femoral Vein: No evidence of thrombus. Normal
compressibility, respiratory phasicity and response to augmentation.

Saphenofemoral Junction: No evidence of thrombus. Normal
compressibility and flow on color Doppler imaging.

Profunda Femoral Vein: No evidence of thrombus. Normal
compressibility and flow on color Doppler imaging.

Femoral Vein: No evidence of thrombus. Normal compressibility,
respiratory phasicity and response to augmentation.

Popliteal Vein: No evidence of thrombus. Normal compressibility,
respiratory phasicity and response to augmentation.

Calf Veins: No evidence of thrombus. Normal compressibility and flow
on color Doppler imaging.

Superficial Great Saphenous Vein: No evidence of thrombus. Normal
compressibility.

Venous Reflux:  None.

Other Findings:  None.
IMPRESSION: No evidence of deep venous thrombosis.
